# Patient Record
Sex: Male | Born: 1950 | Race: Black or African American | Hispanic: No | Marital: Single | Smoking: Never smoker
Health system: Southern US, Community
[De-identification: ages and names within clinical notes are randomized; demographics above are authoritative.]

## PROBLEM LIST (undated history)

## (undated) ENCOUNTER — Emergency Department (HOSPITAL_COMMUNITY): Discharge status: Against Medical Advice | Payer: No Typology Code available for payment source

## (undated) DIAGNOSIS — I2510 Atherosclerotic heart disease of native coronary artery without angina pectoris: Secondary | ICD-10-CM

## (undated) DIAGNOSIS — R209 Unspecified disturbances of skin sensation: Secondary | ICD-10-CM

## (undated) DIAGNOSIS — L909 Atrophic disorder of skin, unspecified: Secondary | ICD-10-CM

## (undated) DIAGNOSIS — L919 Hypertrophic disorder of the skin, unspecified: Secondary | ICD-10-CM

## (undated) DIAGNOSIS — K279 Peptic ulcer, site unspecified, unspecified as acute or chronic, without hemorrhage or perforation: Secondary | ICD-10-CM

## (undated) DIAGNOSIS — M545 Low back pain, unspecified: Secondary | ICD-10-CM

## (undated) DIAGNOSIS — S61409A Unspecified open wound of unspecified hand, initial encounter: Secondary | ICD-10-CM

## (undated) DIAGNOSIS — N318 Other neuromuscular dysfunction of bladder: Secondary | ICD-10-CM

## (undated) DIAGNOSIS — I10 Essential (primary) hypertension: Secondary | ICD-10-CM

## (undated) DIAGNOSIS — K219 Gastro-esophageal reflux disease without esophagitis: Secondary | ICD-10-CM

## (undated) DIAGNOSIS — R252 Cramp and spasm: Secondary | ICD-10-CM

## (undated) DIAGNOSIS — H538 Other visual disturbances: Secondary | ICD-10-CM

## (undated) DIAGNOSIS — K5900 Constipation, unspecified: Secondary | ICD-10-CM

## (undated) DIAGNOSIS — M25569 Pain in unspecified knee: Secondary | ICD-10-CM

## (undated) DIAGNOSIS — E669 Obesity, unspecified: Secondary | ICD-10-CM

## (undated) DIAGNOSIS — M1990 Unspecified osteoarthritis, unspecified site: Secondary | ICD-10-CM

## (undated) DIAGNOSIS — K589 Irritable bowel syndrome without diarrhea: Secondary | ICD-10-CM

## (undated) DIAGNOSIS — E785 Hyperlipidemia, unspecified: Secondary | ICD-10-CM

## (undated) DIAGNOSIS — F411 Generalized anxiety disorder: Secondary | ICD-10-CM

## (undated) DIAGNOSIS — M109 Gout, unspecified: Secondary | ICD-10-CM

## (undated) DIAGNOSIS — M549 Dorsalgia, unspecified: Secondary | ICD-10-CM

## (undated) DIAGNOSIS — E039 Hypothyroidism, unspecified: Secondary | ICD-10-CM

## (undated) DIAGNOSIS — F528 Other sexual dysfunction not due to a substance or known physiological condition: Secondary | ICD-10-CM

## (undated) DIAGNOSIS — I252 Old myocardial infarction: Secondary | ICD-10-CM

## (undated) DIAGNOSIS — F3289 Other specified depressive episodes: Secondary | ICD-10-CM

## (undated) DIAGNOSIS — R001 Bradycardia, unspecified: Secondary | ICD-10-CM

## (undated) DIAGNOSIS — F329 Major depressive disorder, single episode, unspecified: Secondary | ICD-10-CM

## (undated) HISTORY — DX: Low back pain: M54.5

## (undated) HISTORY — DX: Other visual disturbances: H53.8

## (undated) HISTORY — DX: Hyperlipidemia, unspecified: E78.5

## (undated) HISTORY — DX: Atherosclerotic heart disease of native coronary artery without angina pectoris: I25.10

## (undated) HISTORY — DX: Irritable bowel syndrome, unspecified: K58.9

## (undated) HISTORY — DX: Gastro-esophageal reflux disease without esophagitis: K21.9

## (undated) HISTORY — DX: Unspecified open wound of unspecified hand, initial encounter: S61.409A

## (undated) HISTORY — DX: Bradycardia, unspecified: R00.1

## (undated) HISTORY — DX: Cramp and spasm: R25.2

## (undated) HISTORY — DX: Peptic ulcer, site unspecified, unspecified as acute or chronic, without hemorrhage or perforation: K27.9

## (undated) HISTORY — DX: Unspecified osteoarthritis, unspecified site: M19.90

## (undated) HISTORY — DX: Atrophic disorder of skin, unspecified: L90.9

## (undated) HISTORY — DX: Constipation, unspecified: K59.00

## (undated) HISTORY — DX: Old myocardial infarction: I25.2

## (undated) HISTORY — DX: Other sexual dysfunction not due to a substance or known physiological condition: F52.8

## (undated) HISTORY — DX: Unspecified disturbances of skin sensation: R20.9

## (undated) HISTORY — PX: WISDOM TOOTH EXTRACTION: SHX21

## (undated) HISTORY — DX: Other neuromuscular dysfunction of bladder: N31.8

## (undated) HISTORY — DX: Obesity, unspecified: E66.9

## (undated) HISTORY — DX: Low back pain, unspecified: M54.50

## (undated) HISTORY — DX: Essential (primary) hypertension: I10

## (undated) HISTORY — DX: Other specified depressive episodes: F32.89

## (undated) HISTORY — DX: Hypothyroidism, unspecified: E03.9

## (undated) HISTORY — DX: Pain in unspecified knee: M25.569

## (undated) HISTORY — DX: Gout, unspecified: M10.9

## (undated) HISTORY — DX: Dorsalgia, unspecified: M54.9

## (undated) HISTORY — DX: Hypertrophic disorder of the skin, unspecified: L91.9

## (undated) HISTORY — DX: Major depressive disorder, single episode, unspecified: F32.9

## (undated) HISTORY — DX: Generalized anxiety disorder: F41.1

---

## 2000-10-20 ENCOUNTER — Inpatient Hospital Stay (HOSPITAL_COMMUNITY): Admission: EM | Admit: 2000-10-20 | Discharge: 2000-10-24 | Payer: Self-pay | Admitting: Cardiology

## 2000-10-20 ENCOUNTER — Encounter: Payer: Self-pay | Admitting: *Deleted

## 2000-10-22 ENCOUNTER — Encounter: Payer: Self-pay | Admitting: Cardiology

## 2003-04-26 HISTORY — PX: CARDIAC CATHETERIZATION: SHX172

## 2003-09-09 ENCOUNTER — Inpatient Hospital Stay (HOSPITAL_COMMUNITY): Admission: EM | Admit: 2003-09-09 | Discharge: 2003-09-11 | Payer: Self-pay | Admitting: Emergency Medicine

## 2004-10-12 ENCOUNTER — Ambulatory Visit: Payer: Self-pay | Admitting: *Deleted

## 2004-10-21 ENCOUNTER — Encounter (HOSPITAL_COMMUNITY): Admission: RE | Admit: 2004-10-21 | Discharge: 2004-10-22 | Payer: Self-pay | Admitting: *Deleted

## 2004-10-22 ENCOUNTER — Encounter (INDEPENDENT_AMBULATORY_CARE_PROVIDER_SITE_OTHER): Payer: Self-pay | Admitting: Family Medicine

## 2004-10-25 ENCOUNTER — Ambulatory Visit: Payer: Self-pay | Admitting: *Deleted

## 2004-10-25 ENCOUNTER — Ambulatory Visit (HOSPITAL_COMMUNITY): Admission: RE | Admit: 2004-10-25 | Discharge: 2004-10-25 | Payer: Self-pay | Admitting: Cardiology

## 2004-10-27 ENCOUNTER — Ambulatory Visit: Payer: Self-pay | Admitting: Cardiology

## 2004-11-04 ENCOUNTER — Inpatient Hospital Stay (HOSPITAL_COMMUNITY): Admission: EM | Admit: 2004-11-04 | Discharge: 2004-11-05 | Payer: Self-pay | Admitting: *Deleted

## 2004-11-05 ENCOUNTER — Encounter (INDEPENDENT_AMBULATORY_CARE_PROVIDER_SITE_OTHER): Payer: Self-pay | Admitting: Family Medicine

## 2004-11-05 ENCOUNTER — Ambulatory Visit: Payer: Self-pay | Admitting: Cardiology

## 2005-04-08 ENCOUNTER — Encounter (INDEPENDENT_AMBULATORY_CARE_PROVIDER_SITE_OTHER): Payer: Self-pay | Admitting: Family Medicine

## 2005-04-08 ENCOUNTER — Ambulatory Visit: Payer: Self-pay | Admitting: *Deleted

## 2005-10-20 ENCOUNTER — Encounter (INDEPENDENT_AMBULATORY_CARE_PROVIDER_SITE_OTHER): Payer: Self-pay | Admitting: Family Medicine

## 2005-12-16 ENCOUNTER — Ambulatory Visit: Payer: Self-pay | Admitting: Family Medicine

## 2005-12-19 ENCOUNTER — Telehealth (INDEPENDENT_AMBULATORY_CARE_PROVIDER_SITE_OTHER): Payer: Self-pay | Admitting: Family Medicine

## 2006-01-27 ENCOUNTER — Ambulatory Visit: Payer: Self-pay | Admitting: Family Medicine

## 2006-01-30 ENCOUNTER — Encounter (INDEPENDENT_AMBULATORY_CARE_PROVIDER_SITE_OTHER): Payer: Self-pay | Admitting: Family Medicine

## 2006-02-17 ENCOUNTER — Ambulatory Visit: Payer: Self-pay | Admitting: Family Medicine

## 2006-03-31 ENCOUNTER — Encounter (INDEPENDENT_AMBULATORY_CARE_PROVIDER_SITE_OTHER): Payer: Self-pay | Admitting: Family Medicine

## 2006-04-09 ENCOUNTER — Encounter: Payer: Self-pay | Admitting: Family Medicine

## 2006-04-09 DIAGNOSIS — M545 Low back pain: Secondary | ICD-10-CM

## 2006-04-09 DIAGNOSIS — K219 Gastro-esophageal reflux disease without esophagitis: Secondary | ICD-10-CM | POA: Insufficient documentation

## 2006-04-09 DIAGNOSIS — M1990 Unspecified osteoarthritis, unspecified site: Secondary | ICD-10-CM | POA: Insufficient documentation

## 2006-04-09 DIAGNOSIS — J45909 Unspecified asthma, uncomplicated: Secondary | ICD-10-CM | POA: Insufficient documentation

## 2006-04-09 DIAGNOSIS — K279 Peptic ulcer, site unspecified, unspecified as acute or chronic, without hemorrhage or perforation: Secondary | ICD-10-CM | POA: Insufficient documentation

## 2006-04-09 DIAGNOSIS — K589 Irritable bowel syndrome without diarrhea: Secondary | ICD-10-CM | POA: Insufficient documentation

## 2006-04-09 DIAGNOSIS — F329 Major depressive disorder, single episode, unspecified: Secondary | ICD-10-CM

## 2006-04-09 DIAGNOSIS — F411 Generalized anxiety disorder: Secondary | ICD-10-CM | POA: Insufficient documentation

## 2006-04-09 DIAGNOSIS — I10 Essential (primary) hypertension: Secondary | ICD-10-CM | POA: Insufficient documentation

## 2006-04-09 DIAGNOSIS — E039 Hypothyroidism, unspecified: Secondary | ICD-10-CM | POA: Insufficient documentation

## 2006-04-09 DIAGNOSIS — E785 Hyperlipidemia, unspecified: Secondary | ICD-10-CM | POA: Insufficient documentation

## 2006-04-09 DIAGNOSIS — K5900 Constipation, unspecified: Secondary | ICD-10-CM | POA: Insufficient documentation

## 2006-04-09 DIAGNOSIS — N318 Other neuromuscular dysfunction of bladder: Secondary | ICD-10-CM

## 2006-04-13 ENCOUNTER — Ambulatory Visit: Payer: Self-pay | Admitting: Family Medicine

## 2006-04-28 ENCOUNTER — Encounter (INDEPENDENT_AMBULATORY_CARE_PROVIDER_SITE_OTHER): Payer: Self-pay | Admitting: Family Medicine

## 2006-06-14 ENCOUNTER — Telehealth (INDEPENDENT_AMBULATORY_CARE_PROVIDER_SITE_OTHER): Payer: Self-pay | Admitting: *Deleted

## 2006-06-15 ENCOUNTER — Ambulatory Visit: Payer: Self-pay | Admitting: Family Medicine

## 2006-06-15 LAB — CONVERTED CEMR LAB
Cholesterol, target level: 200 mg/dL
HDL goal, serum: 40 mg/dL

## 2006-09-28 ENCOUNTER — Encounter (INDEPENDENT_AMBULATORY_CARE_PROVIDER_SITE_OTHER): Payer: Self-pay | Admitting: Family Medicine

## 2006-10-05 ENCOUNTER — Ambulatory Visit: Payer: Self-pay | Admitting: Family Medicine

## 2006-10-06 ENCOUNTER — Encounter (INDEPENDENT_AMBULATORY_CARE_PROVIDER_SITE_OTHER): Payer: Self-pay | Admitting: Family Medicine

## 2006-10-12 ENCOUNTER — Encounter (INDEPENDENT_AMBULATORY_CARE_PROVIDER_SITE_OTHER): Payer: Self-pay | Admitting: Family Medicine

## 2006-10-12 LAB — CONVERTED CEMR LAB
AST: 22 units/L (ref 0–37)
Alkaline Phosphatase: 51 units/L (ref 39–117)
Basophils Absolute: 0 10*3/uL (ref 0.0–0.1)
CO2: 26 meq/L (ref 19–32)
Chloride: 105 meq/L (ref 96–112)
Cholesterol: 208 mg/dL — ABNORMAL HIGH (ref 0–200)
Glucose, Bld: 108 mg/dL — ABNORMAL HIGH (ref 70–99)
HCT: 39.4 % (ref 39.0–52.0)
HDL: 35 mg/dL — ABNORMAL LOW (ref >39)
LDL Cholesterol: 142 mg/dL — ABNORMAL HIGH (ref 0–99)
MCV: 94 fL (ref 78.0–100.0)
Monocytes Relative: 5 % (ref 3–11)
Neutro Abs: 2 10*3/uL (ref 1.7–7.7)
Neutrophils Relative %: 42 % — ABNORMAL LOW (ref 43–77)
Platelets: 300 10*3/uL (ref 150–400)
RDW: 13.5 % (ref 11.5–14.0)
Sodium: 141 meq/L (ref 135–145)
Total CHOL/HDL Ratio: 5.9
Triglycerides: 154 mg/dL — ABNORMAL HIGH (ref <150)
VLDL: 31 mg/dL (ref 0–40)

## 2006-10-18 ENCOUNTER — Encounter (INDEPENDENT_AMBULATORY_CARE_PROVIDER_SITE_OTHER): Payer: Self-pay | Admitting: Family Medicine

## 2006-10-19 ENCOUNTER — Telehealth (INDEPENDENT_AMBULATORY_CARE_PROVIDER_SITE_OTHER): Payer: Self-pay | Admitting: *Deleted

## 2006-10-19 ENCOUNTER — Ambulatory Visit: Payer: Self-pay | Admitting: Family Medicine

## 2006-10-19 DIAGNOSIS — F528 Other sexual dysfunction not due to a substance or known physiological condition: Secondary | ICD-10-CM

## 2006-10-19 DIAGNOSIS — E669 Obesity, unspecified: Secondary | ICD-10-CM

## 2006-10-20 ENCOUNTER — Encounter (INDEPENDENT_AMBULATORY_CARE_PROVIDER_SITE_OTHER): Payer: Self-pay | Admitting: Family Medicine

## 2006-10-20 LAB — CONVERTED CEMR LAB
Ferritin: 189 ng/mL (ref 22–322)
Retic Count, Absolute: 65.1 (ref 19.0–186.0)
Retic Ct Pct: 1.5 % (ref 0.4–3.1)
UIBC: 201 ug/dL
Vitamin B-12: 366 pg/mL (ref 211–911)

## 2006-11-10 ENCOUNTER — Ambulatory Visit: Payer: Self-pay | Admitting: Cardiology

## 2006-11-10 ENCOUNTER — Encounter (INDEPENDENT_AMBULATORY_CARE_PROVIDER_SITE_OTHER): Payer: Self-pay | Admitting: Family Medicine

## 2006-12-05 ENCOUNTER — Ambulatory Visit: Payer: Self-pay | Admitting: Family Medicine

## 2007-01-17 ENCOUNTER — Ambulatory Visit: Payer: Self-pay | Admitting: Family Medicine

## 2007-01-17 ENCOUNTER — Telehealth (INDEPENDENT_AMBULATORY_CARE_PROVIDER_SITE_OTHER): Payer: Self-pay | Admitting: *Deleted

## 2007-01-17 DIAGNOSIS — M549 Dorsalgia, unspecified: Secondary | ICD-10-CM | POA: Insufficient documentation

## 2007-01-17 DIAGNOSIS — M109 Gout, unspecified: Secondary | ICD-10-CM

## 2007-01-18 ENCOUNTER — Ambulatory Visit (HOSPITAL_COMMUNITY): Admission: RE | Admit: 2007-01-18 | Discharge: 2007-01-18 | Payer: Self-pay | Admitting: Family Medicine

## 2007-01-18 ENCOUNTER — Telehealth (INDEPENDENT_AMBULATORY_CARE_PROVIDER_SITE_OTHER): Payer: Self-pay | Admitting: *Deleted

## 2007-01-18 ENCOUNTER — Encounter (INDEPENDENT_AMBULATORY_CARE_PROVIDER_SITE_OTHER): Payer: Self-pay | Admitting: Family Medicine

## 2007-01-18 LAB — CONVERTED CEMR LAB
Basophils Relative: 1 % (ref 0–1)
Eosinophils Absolute: 0.1 10*3/uL (ref 0.0–0.7)
Eosinophils Relative: 3 % (ref 0–5)
HCT: 39.4 % (ref 39.0–52.0)
Hemoglobin: 13.2 g/dL (ref 13.0–17.0)
Neutro Abs: 1.8 10*3/uL (ref 1.7–7.7)
RBC: 4.39 M/uL (ref 4.22–5.81)
TSH: 5.218 microintl units/mL (ref 0.350–5.50)

## 2007-01-22 ENCOUNTER — Encounter (INDEPENDENT_AMBULATORY_CARE_PROVIDER_SITE_OTHER): Payer: Self-pay | Admitting: Family Medicine

## 2007-01-22 ENCOUNTER — Telehealth (INDEPENDENT_AMBULATORY_CARE_PROVIDER_SITE_OTHER): Payer: Self-pay | Admitting: *Deleted

## 2007-01-24 ENCOUNTER — Telehealth (INDEPENDENT_AMBULATORY_CARE_PROVIDER_SITE_OTHER): Payer: Self-pay | Admitting: *Deleted

## 2007-02-12 ENCOUNTER — Encounter (INDEPENDENT_AMBULATORY_CARE_PROVIDER_SITE_OTHER): Payer: Self-pay | Admitting: Family Medicine

## 2007-02-14 ENCOUNTER — Ambulatory Visit: Payer: Self-pay | Admitting: Family Medicine

## 2007-02-19 ENCOUNTER — Ambulatory Visit (HOSPITAL_COMMUNITY): Admission: RE | Admit: 2007-02-19 | Discharge: 2007-02-19 | Payer: Self-pay | Admitting: Internal Medicine

## 2007-02-19 ENCOUNTER — Ambulatory Visit: Payer: Self-pay | Admitting: Internal Medicine

## 2007-03-06 ENCOUNTER — Emergency Department (HOSPITAL_COMMUNITY): Admission: EM | Admit: 2007-03-06 | Discharge: 2007-03-06 | Payer: Self-pay | Admitting: Emergency Medicine

## 2007-03-07 ENCOUNTER — Ambulatory Visit: Payer: Self-pay | Admitting: Urgent Care

## 2007-05-17 ENCOUNTER — Ambulatory Visit: Payer: Self-pay | Admitting: Family Medicine

## 2007-05-21 ENCOUNTER — Encounter (INDEPENDENT_AMBULATORY_CARE_PROVIDER_SITE_OTHER): Payer: Self-pay | Admitting: Family Medicine

## 2007-05-22 ENCOUNTER — Telehealth (INDEPENDENT_AMBULATORY_CARE_PROVIDER_SITE_OTHER): Payer: Self-pay | Admitting: *Deleted

## 2007-05-22 LAB — CONVERTED CEMR LAB
AST: 18 units/L (ref 0–37)
Albumin: 4.1 g/dL (ref 3.5–5.2)
Basophils Relative: 0 % (ref 0–1)
CO2: 28 meq/L (ref 19–32)
Calcium: 9.7 mg/dL (ref 8.4–10.5)
Creatinine, Ser: 1.2 mg/dL (ref 0.40–1.50)
Eosinophils Relative: 3 % (ref 0–5)
Glucose, Bld: 98 mg/dL (ref 70–99)
HDL: 39 mg/dL — ABNORMAL LOW (ref >39)
Hemoglobin: 13.4 g/dL (ref 13.0–17.0)
Lymphocytes Relative: 51 % — ABNORMAL HIGH (ref 12–46)
Lymphs Abs: 2.4 10*3/uL (ref 0.7–4.0)
Monocytes Absolute: 0.4 10*3/uL (ref 0.1–1.0)
Neutro Abs: 1.7 10*3/uL (ref 1.7–7.7)
Potassium: 4.2 meq/L (ref 3.5–5.3)
RBC: 4.36 M/uL (ref 4.22–5.81)
RDW: 13.1 % (ref 11.5–15.5)
TSH: 4.53 microintl units/mL (ref 0.350–5.50)
Total Protein: 7.3 g/dL (ref 6.0–8.3)

## 2007-05-31 ENCOUNTER — Ambulatory Visit: Payer: Self-pay | Admitting: Family Medicine

## 2007-08-30 ENCOUNTER — Ambulatory Visit: Payer: Self-pay | Admitting: Family Medicine

## 2007-08-30 LAB — CONVERTED CEMR LAB
Blood in Urine, dipstick: NEGATIVE
Glucose, Urine, Semiquant: NEGATIVE
Ketones, urine, test strip: NEGATIVE
Protein, U semiquant: NEGATIVE
WBC Urine, dipstick: NEGATIVE
pH: 5.5

## 2007-09-07 ENCOUNTER — Ambulatory Visit: Payer: Self-pay | Admitting: Family Medicine

## 2007-09-08 ENCOUNTER — Encounter (INDEPENDENT_AMBULATORY_CARE_PROVIDER_SITE_OTHER): Payer: Self-pay | Admitting: Family Medicine

## 2007-09-10 ENCOUNTER — Telehealth (INDEPENDENT_AMBULATORY_CARE_PROVIDER_SITE_OTHER): Payer: Self-pay | Admitting: *Deleted

## 2007-10-01 ENCOUNTER — Ambulatory Visit: Payer: Self-pay | Admitting: Internal Medicine

## 2007-10-15 ENCOUNTER — Telehealth (INDEPENDENT_AMBULATORY_CARE_PROVIDER_SITE_OTHER): Payer: Self-pay | Admitting: Family Medicine

## 2007-10-16 ENCOUNTER — Ambulatory Visit: Payer: Self-pay | Admitting: Family Medicine

## 2007-11-12 ENCOUNTER — Ambulatory Visit: Payer: Self-pay | Admitting: Family Medicine

## 2007-11-13 ENCOUNTER — Encounter (INDEPENDENT_AMBULATORY_CARE_PROVIDER_SITE_OTHER): Payer: Self-pay | Admitting: Family Medicine

## 2007-11-13 LAB — CONVERTED CEMR LAB
BUN: 15 mg/dL (ref 6–23)
Glucose, Bld: 84 mg/dL (ref 70–99)
Sodium: 141 meq/L (ref 135–145)

## 2007-12-10 ENCOUNTER — Ambulatory Visit: Payer: Self-pay | Admitting: Family Medicine

## 2007-12-18 ENCOUNTER — Encounter (INDEPENDENT_AMBULATORY_CARE_PROVIDER_SITE_OTHER): Payer: Self-pay | Admitting: Family Medicine

## 2007-12-19 LAB — CONVERTED CEMR LAB
Bilirubin, Direct: <0.1 mg/dL (ref 0.0–0.3)
Total Bilirubin: 0.3 mg/dL (ref 0.3–1.2)
Total CHOL/HDL Ratio: 5.3
VLDL: 28 mg/dL (ref 0–40)

## 2007-12-20 ENCOUNTER — Encounter (INDEPENDENT_AMBULATORY_CARE_PROVIDER_SITE_OTHER): Payer: Self-pay | Admitting: Family Medicine

## 2008-01-11 ENCOUNTER — Ambulatory Visit: Payer: Self-pay | Admitting: Family Medicine

## 2008-01-12 ENCOUNTER — Encounter (INDEPENDENT_AMBULATORY_CARE_PROVIDER_SITE_OTHER): Payer: Self-pay | Admitting: Family Medicine

## 2008-01-15 LAB — CONVERTED CEMR LAB: Uric Acid, Serum: 9.9 mg/dL — ABNORMAL HIGH (ref 4.0–7.8)

## 2008-01-18 ENCOUNTER — Ambulatory Visit: Payer: Self-pay | Admitting: Family Medicine

## 2008-01-25 ENCOUNTER — Telehealth (INDEPENDENT_AMBULATORY_CARE_PROVIDER_SITE_OTHER): Payer: Self-pay | Admitting: Family Medicine

## 2008-01-31 ENCOUNTER — Encounter (INDEPENDENT_AMBULATORY_CARE_PROVIDER_SITE_OTHER): Payer: Self-pay | Admitting: Family Medicine

## 2008-02-29 ENCOUNTER — Ambulatory Visit: Payer: Self-pay | Admitting: Family Medicine

## 2008-03-01 ENCOUNTER — Encounter (INDEPENDENT_AMBULATORY_CARE_PROVIDER_SITE_OTHER): Payer: Self-pay | Admitting: Family Medicine

## 2008-03-03 LAB — CONVERTED CEMR LAB: TSH: 3.429 microintl units/mL (ref 0.350–4.50)

## 2008-03-21 ENCOUNTER — Encounter (INDEPENDENT_AMBULATORY_CARE_PROVIDER_SITE_OTHER): Payer: Self-pay | Admitting: Family Medicine

## 2008-03-25 ENCOUNTER — Encounter (INDEPENDENT_AMBULATORY_CARE_PROVIDER_SITE_OTHER): Payer: Self-pay | Admitting: *Deleted

## 2008-03-25 LAB — CONVERTED CEMR LAB
ALT: 23 units/L (ref 0–53)
Calcium: 9.2 mg/dL (ref 8.4–10.5)
Chloride: 104 meq/L (ref 96–112)
Cholesterol: 204 mg/dL — ABNORMAL HIGH (ref 0–200)
HDL: 36 mg/dL — ABNORMAL LOW (ref >39)
Potassium: 4.2 meq/L (ref 3.5–5.3)
Total Protein: 7 g/dL (ref 6.0–8.3)
Triglycerides: 171 mg/dL — ABNORMAL HIGH (ref <150)
Uric Acid, Serum: 9 mg/dL — ABNORMAL HIGH (ref 4.0–7.8)

## 2008-04-01 ENCOUNTER — Ambulatory Visit: Payer: Self-pay | Admitting: Cardiology

## 2008-04-01 DIAGNOSIS — I2510 Atherosclerotic heart disease of native coronary artery without angina pectoris: Secondary | ICD-10-CM

## 2008-04-14 ENCOUNTER — Ambulatory Visit: Payer: Self-pay | Admitting: Family Medicine

## 2008-06-16 ENCOUNTER — Ambulatory Visit: Payer: Self-pay | Admitting: Family Medicine

## 2008-06-20 ENCOUNTER — Encounter (INDEPENDENT_AMBULATORY_CARE_PROVIDER_SITE_OTHER): Payer: Self-pay | Admitting: Family Medicine

## 2008-09-19 ENCOUNTER — Encounter (INDEPENDENT_AMBULATORY_CARE_PROVIDER_SITE_OTHER): Payer: Self-pay | Admitting: Family Medicine

## 2008-10-13 ENCOUNTER — Ambulatory Visit: Payer: Self-pay | Admitting: Family Medicine

## 2008-10-13 ENCOUNTER — Encounter (INDEPENDENT_AMBULATORY_CARE_PROVIDER_SITE_OTHER): Payer: Self-pay | Admitting: *Deleted

## 2008-10-13 DIAGNOSIS — J301 Allergic rhinitis due to pollen: Secondary | ICD-10-CM

## 2008-10-13 LAB — CONVERTED CEMR LAB
ALT: 20 units/L
Albumin: 4 g/dL
Alkaline Phosphatase: 54 units/L
Calcium: 9.2 mg/dL
Creatinine, Ser: 1.11 mg/dL
Glucose, Bld: 115 mg/dL
Potassium: 3.9 meq/L
Sodium: 142 meq/L
Total Protein: 7.3 g/dL

## 2008-10-14 ENCOUNTER — Encounter (INDEPENDENT_AMBULATORY_CARE_PROVIDER_SITE_OTHER): Payer: Self-pay | Admitting: Family Medicine

## 2008-10-14 ENCOUNTER — Encounter (INDEPENDENT_AMBULATORY_CARE_PROVIDER_SITE_OTHER): Payer: Self-pay | Admitting: *Deleted

## 2008-10-14 LAB — CONVERTED CEMR LAB
ALT: 20 units/L (ref 0–53)
BUN: 17 mg/dL (ref 6–23)
Chloride: 108 meq/L (ref 96–112)
Creatinine, Ser: 1.11 mg/dL (ref 0.40–1.50)
HDL: 35 mg/dL — ABNORMAL LOW (ref >39)
LDL Cholesterol: 129 mg/dL — ABNORMAL HIGH (ref 0–99)
Potassium: 3.9 meq/L (ref 3.5–5.3)
TSH: 3.485 microintl units/mL (ref 0.350–4.500)
VLDL: 25 mg/dL (ref 0–40)

## 2008-12-08 ENCOUNTER — Ambulatory Visit: Payer: Self-pay | Admitting: Family Medicine

## 2008-12-12 ENCOUNTER — Ambulatory Visit: Payer: Self-pay | Admitting: Cardiology

## 2008-12-12 DIAGNOSIS — I495 Sick sinus syndrome: Secondary | ICD-10-CM | POA: Insufficient documentation

## 2009-12-02 ENCOUNTER — Encounter (INDEPENDENT_AMBULATORY_CARE_PROVIDER_SITE_OTHER): Payer: Self-pay | Admitting: *Deleted

## 2009-12-04 ENCOUNTER — Encounter: Payer: Self-pay | Admitting: Cardiology

## 2009-12-04 ENCOUNTER — Encounter (INDEPENDENT_AMBULATORY_CARE_PROVIDER_SITE_OTHER): Payer: Self-pay | Admitting: *Deleted

## 2009-12-08 ENCOUNTER — Encounter (INDEPENDENT_AMBULATORY_CARE_PROVIDER_SITE_OTHER): Payer: Self-pay | Admitting: *Deleted

## 2009-12-08 ENCOUNTER — Encounter: Payer: Self-pay | Admitting: Adult Health

## 2009-12-08 ENCOUNTER — Ambulatory Visit: Payer: Self-pay | Admitting: Cardiology

## 2009-12-08 DIAGNOSIS — R079 Chest pain, unspecified: Secondary | ICD-10-CM

## 2009-12-08 DIAGNOSIS — R011 Cardiac murmur, unspecified: Secondary | ICD-10-CM

## 2009-12-09 LAB — CONVERTED CEMR LAB
Cholesterol: 201 mg/dL — ABNORMAL HIGH (ref 0–200)
LDL Cholesterol: 144 mg/dL — ABNORMAL HIGH (ref 0–99)

## 2009-12-11 ENCOUNTER — Encounter (HOSPITAL_COMMUNITY): Admission: RE | Admit: 2009-12-11 | Discharge: 2010-01-10 | Payer: Self-pay | Admitting: Cardiology

## 2009-12-11 ENCOUNTER — Ambulatory Visit: Payer: Self-pay | Admitting: Cardiology

## 2009-12-11 ENCOUNTER — Encounter: Payer: Self-pay | Admitting: Cardiology

## 2009-12-22 ENCOUNTER — Ambulatory Visit: Payer: Self-pay | Admitting: Cardiology

## 2009-12-31 ENCOUNTER — Ambulatory Visit (HOSPITAL_COMMUNITY): Admission: RE | Admit: 2009-12-31 | Discharge: 2009-12-31 | Payer: Self-pay | Admitting: Internal Medicine

## 2010-01-04 ENCOUNTER — Ambulatory Visit: Payer: Self-pay | Admitting: Cardiology

## 2010-01-05 ENCOUNTER — Encounter (INDEPENDENT_AMBULATORY_CARE_PROVIDER_SITE_OTHER): Payer: Self-pay | Admitting: *Deleted

## 2010-01-15 ENCOUNTER — Encounter (INDEPENDENT_AMBULATORY_CARE_PROVIDER_SITE_OTHER): Payer: Self-pay | Admitting: *Deleted

## 2010-02-09 ENCOUNTER — Emergency Department (HOSPITAL_COMMUNITY): Admission: EM | Admit: 2010-02-09 | Discharge: 2010-02-10 | Payer: Self-pay | Admitting: Emergency Medicine

## 2010-04-12 ENCOUNTER — Ambulatory Visit: Payer: Self-pay | Admitting: Cardiology

## 2010-05-16 ENCOUNTER — Encounter: Payer: Self-pay | Admitting: Family Medicine

## 2010-05-23 LAB — CONVERTED CEMR LAB
Albumin: 4.2 g/dL (ref 3.5–5.2)
Alkaline Phosphatase: 49 units/L (ref 39–117)
Chloride: 104 meq/L (ref 96–112)
Glucose, Bld: 92 mg/dL (ref 70–99)
HDL: 40 mg/dL (ref >39)
Potassium: 4.4 meq/L (ref 3.5–5.3)
Total Bilirubin: 0.4 mg/dL (ref 0.3–1.2)
Total CHOL/HDL Ratio: 4.9
Triglycerides: 110 mg/dL (ref <150)
WBC, blood: 4.1 10*3/uL

## 2010-05-25 NOTE — Assessment & Plan Note (Signed)
Summary: friday bp check per checkout on 12/22/09/tg  Nurse Visit   Vital Signs:  Patient profile:   60 year old male Height:      75 inches Weight:      259 pounds BMI:     32.49 O2 Sat:      98 % on Room air Pulse rate:   45 / minute Pulse rhythm:   regular BP sitting:   124 / 80  (left arm)  Vitals Entered By: Teressa Lower RN (January 04, 2010 9:27 AM)  Nutrition Counseling: Patient's BMI is greater than 25 and therefore counseled on weight management options.  O2 Flow:  Room air  Visit Type:  2 week nurse visit Primary Provider:  Dr.Fanta   History of Present Illness: S:  2 week follow up B:  ov 12/22/09, started amlodipine 10mg  daily A:  no c/o  R: gave handouts on low salt diet and low cholesterol diets   Current Medications (verified): 1)  Aspirin 81 Mg Tbec (Aspirin) .... Once Daily 2)  Synthroid 75 Mcg Tabs (Levothyroxine Sodium) .... Once Daily 3)  Garlic 500 Mg  Tabs (Garlic) .... Two Times A Day 4)  Norvasc 10 Mg Tabs (Amlodipine Besylate) .... Take 1 Tablet By Mouth Once Daily 5)  Indomethacin 50 Mg Caps (Indomethacin) .... Take As Needed  Allergies: 1)  ! * Plavix 2)  ! Altace 3)  ! Uloric (Febuxostat) 4)  ! * Avorastatin. 5)  ! Celebrex  Continue medications. Follow up appointment as scheduled./per Joni Reining NP    Teressa Lower RN  January 05, 2010 10:40 AM  I was unable to reach Mr. Loeza by telephone, sent pt a letter re: nurse visit

## 2010-05-25 NOTE — Miscellaneous (Signed)
Summary: labs cmp,lipids,tsh,10/13/2008  Clinical Lists Changes  Observations: Added new observation of CALCIUM: 9.2 mg/dL (16/01/9603 54:09) Added new observation of ALBUMIN: 4.0 g/dL (81/19/1478 29:56) Added new observation of PROTEIN, TOT: 7.3 g/dL (21/30/8657 84:69) Added new observation of SGPT (ALT): 20 units/L (10/13/2008 10:15) Added new observation of SGOT (AST): 33 units/L (10/13/2008 10:15) Added new observation of ALK PHOS: 54 units/L (10/13/2008 10:15) Added new observation of CREATININE: 1.11 mg/dL (62/95/2841 32:44) Added new observation of BUN: 17 mg/dL (04/27/7251 66:44) Added new observation of BG RANDOM: 115 mg/dL (03/47/4259 56:38) Added new observation of CO2 PLSM/SER: 20 meq/L (10/13/2008 10:15) Added new observation of CL SERUM: 108 meq/L (10/13/2008 10:15) Added new observation of K SERUM: 3.9 meq/L (10/13/2008 10:15) Added new observation of NA: 142 meq/L (10/13/2008 10:15) Added new observation of LDL: 129 mg/dL (75/64/3329 51:88) Added new observation of HDL: 35 mg/dL (41/66/0630 16:01) Added new observation of TRIGLYC TOT: 126 mg/dL (09/32/3557 32:20) Added new observation of CHOLESTEROL: 189 mg/dL (25/42/7062 37:62) Added new observation of TSH: 3.485 microintl units/mL (10/13/2008 10:15)

## 2010-05-25 NOTE — Letter (Signed)
Summary: [place] Results Engineer, agricultural at [facility]  [address] [place] [address]   Phone: [phone]  Fax: [phone]      January 15, 2010 MRN: [id number]   [facility] [place] [address] [place], [place]  [address]   Dear Mr. [name] [name],  Your test ordered by Selena Batten has been reviewed by your physician (or physician assistant) and was found to be normal or stable. Your physician (or physician assistant) felt no changes were needed at this time.  __x__ Echocardiogram  ____ Cardiac Stress Test  ____ Lab Work  ____ Peripheral vascular study of arms, legs or neck  ____ CT scan or X-ray  ____ Lung or Breathing test  ____ Other:  No change in medical treatment at this time, per Dr. Dietrich Pates.  Thank you, Tammy Allyne Gee RN    Bainbridge Bing, MD, Lenise Arena.C.Gaylord Shih, MD, F.A.C.C Lewayne Bunting, MD, F.A.C.C Nona Dell, MD, F.A.C.C Charlton Haws, MD, Lenise Arena.C.C

## 2010-05-25 NOTE — Letter (Signed)
Summary: [name] [name] (Nuc Med Stress)  [facility]  [address], [place] [address]   Phone: [phone]  Fax: [phone]    Nuclear Medicine 1-Day Stress Test Information Sheet  Re:     [name] [name]   DOB:     Oct 12, 1950 MRN:     [id number] Weight:  Appointment Date: Register at: Appointment Time: Referring MD:  _X__Exercise Stress  __Adenosine   __Dobutamine  __Lexiscan  __Persantine   __Thallium  Urgency: ____1 (next day)   ____2 (one week)    ____3 (PRN)  Patient will receive Follow Up call with results: Patient needs follow-up appointment:  Instructions regarding medication:  How to prepare for your stress test: 1. DO NOT eat or dring 6 hours prior to your arrival time. This includes no caffeine (coffee, tea, sodas, chocolate) if you were instructed to take your medications, drink water with it. 2. DO NOT use any tobacco products for at leaset 8 hours prior to arrival. 3. DO NOT wear dresses or any clothing that may have metal clasps or buttons. 4. Wear short sleeve shirts, loose clothing, and comfortalbe walking shoes. 5. DO NOT use lotions, oils or powder on your chest before the test. 6. The test will take approximately 3-4 hours from the time you arrive until completion. 7. To register the day of the test, go to the Short Stay entrance at [facility]. 8. If you must cancel your test, call [phone] as soon as you are aware.  After you arrive for test:   When you arrive at [facility], you will go to Short Stay to be registered. They will then send you to Radiology to check in. The Nuclear Medicine Tech will get you and start an IV in your arm or hand. A small amount of a radioactive tracer will then be injected into your IV. This tracer will then have to circulate for 30-45 minutes. During this time you will wait in the waiting room and you will be able to drink something without caffeine. A series of pictures will be taken  of your heart follwoing this waiting period. After the 1st set of pictures you will go to the stress lab to get ready for your stress test. During the stress test, another small amount of a radioactive tracer will be injected through your IV. When the stress test is complete, there is a short rest period while your heart rate and blood pressure will be monitored. When this monitoring period is complete you will have another set of pictrues taken. (The same as the 1st set of pictures). These pictures are taken between 15 minutes and 1 hour after the stress test. The time depends on the type of stress test you had. Your doctor will inform you of your test results within 7 days after test.    The possibilities of certain changes are possible during the test. They include abnormal blood pressure and disorders of the heart. Side effects of persantine or adenosine can include flushing, chest pain, shortness of breath, stomach tightness, headache and light-headedness. These side effects usually do not last long and are self-resolving. Every effort will be made to keep you comfortable and to minimize complications by obtaining a medical history and by close observation during the test. Emergency equipment, medications, and trained personnel are available to deal with any unusual situation which may arise.  Please notify office at least 48 hours in advance if you are unable to keep  this appt.

## 2010-05-25 NOTE — Assessment & Plan Note (Signed)
Summary: 1 yr fu/sn   Visit Type:  Follow-up Primary Provider:  Dr.Fanta  CC:  no cardiology complaints.  History of Present Illness: [name] [name] is a 60 y/o AAM who we are seeing on annual follow-up for nonobstructive CAD, Hypertension, hypercholesterolemia.  He has not been taking antihypertensives for several months and has also been noncompliant on other medications.  He states he is easily  allergic to several medications, to include ACE-inhibitors and ARB's.  He was on Norvasc 5 mg on last visit, and BP was controlled at that time.  He has had frequent bouts of gout and some substernal chest pain with exertion.  He takes an aspirin and about later, he feels better.  He is seeing Dr. Felecia Shelling now who has done recent lab work for cholesterol, but has not prescribed a medication for this.  He is otherwise doing well, and continues to be active.  He has lost approximately 20lbs since last visit.  Current Medications (verified): 1)  Aspirin 81 Mg Tbec (Aspirin) .... Once Daily 2)  Synthroid 75 Mcg Tabs (Levothyroxine Sodium) .... Once Daily 3)  Garlic 500 Mg  Tabs (Garlic) .... Two Times A Day 4)  Caduet 5-20 Mg Tabs (Amlodipine-Atorvastatin) .... Take 1 Tablet By Mouth Once A Day  Allergies (verified): 1)  ! * Plavix 2)  ! Altace 3)  ! Uloric (Febuxostat)  Past History:  Past medical, surgical, family and social histories (including risk factors) reviewed, and no changes noted (except as noted below).  Past Medical History: Reviewed history from 12/12/2008 and no changes required. CORONARY ARTERY DISEASE (ICD-414.00): Nonobstructive at catheterization in 10/2004. Cardiolyte 2006: EF          57 %; Ant Lat Sept Iscemia HYPOTHYROIDISM (ICD-244.9) HYPERTENSION (ICD-401.9) HYPERLIPIDEMIA (ICD-272.4)ASTHMA (ICD-493.90) ERECTILE DYSFUNCTION (ICD-302.72) OBESITY NOS (ICD-278.00) OVERACTIVE BLADDER (ICD-596.51) CONSTIPATION NOS (ICD-564.00) IBS (ICD-564.1) PUD (ICD-533.90) Sinus  bradycardia. OSTEOARTHRITIS (ICD-715.90):known cervical spine disease with chronic neck and shoulder discomfort MYOCARDIAL INFARCTION, HX OF (ICD-412) LOW BACK PAIN (ICD-724.2) GERD (ICD-530.81) DEPRESSION (ICD-311) ANXIETY (ICD-300.00) BLURRED VISION (ICD-368.8) PARESTHESIA - RIGHT LEG (ICD-782.0) LEG CRAMPS (ICD-729.82) LACERATION, HAND, RIGHT (ICD-882.0) KNEE PAIN, RIGHT (ICD-719.46) SKIN TAG (ICD-701.9) BACK PAIN (ICD-724.5) GOUT NOS (ICD-274.9)  Past Surgical History: Reviewed history from 10/13/2008 and no changes required. 1. Wisdom Teeth 2. Heart Cath 2005: 50 % Sept  and EF 65%, Non ostructive large vessel CAD  Family History: Reviewed history from 10/13/2008 and no changes required. Father: Dead 58 ? Lung Cancer Mother: 38 HTN/Alzheimers Siblings: Sisters 44 and 50 and brother 45 - CAD/HTN/DM Kids: None  Social History: Reviewed history from 10/13/2008 and no changes required. 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  All other systems have been reviewed and are negative unless stated above. \par Vital Signs:  Patient profile:   60 year old male Weight:      264 pounds BMI:     34.02 Pulse rate:   51 / minute BP sitting:   162 / 87  (right arm)  Vitals Entered By: Dreama Saa, CNA (December 08, 2009 2:14 PM)  Physical Exam  General:  Well developed, well nourished, in no acute distress. Head:  normocephalic and atraumatic Eyes:  PERRLA/EOM intact; conjunctiva and lids normal. Lungs:  Clear bilaterally to auscultation and percussion. Heart:  2/6 systolic murnur loudest at primary aortic area, with  out radiation to the carotids. RRR Abdomen:  Bowel sounds positive; abdomen soft and non-tender without masses, organomegaly, or hernias noted. No hepatosplenomegaly. Obese Msk:  Back normal, normal gait. Muscle strength and  tone normal. Pulses:  pulses normal in all 4 extremities Extremities:  trace left pedal edema and trace right pedal edema.   Neurologic:  Alert and oriented x 3. Psych:  Normal affect.   Impression & Recommendations:  Problem # 1:  HYPERTENSION (ICD-401.9) He has not been taking antihypertensives for several months.  I have given him samples of caduet (amlopdine 5mg /avorastatin 20mg ) to assit with BP control as he is intolerant to ARB's and ACE-inhibitors.  He has been on amlodipine in the past and tolerated it.  If his BP is better controlled, will provide a Rx for this.  He states that cost is an issue for him.  Will monitor closely, The following medications were removed from the medication list:    Norvasc 10 Mg Tabs (Amlodipine besylate) .Marland Kitchen... 1/2 by mouth daily His updated medication list for this problem includes:    Aspirin 81 Mg Tbec (Aspirin) ..... Once daily    Caduet 5-20 Mg Tabs (Amlodipine-atorvastatin) .Marland Kitchen... Take 1 tablet by mouth once a day  Orders: EKG w/ Interpretation (93000)  Problem # 2:  HYPERLIPIDEMIA (ICD-272.4) As above, avorastatin is given to him as sample.  LDL is 129, TC 186.  Would hope for improvement in levels on next blood draw.  I do not see any intolerances with this medication on review of his records. The following medications were removed from the medication list:    Welchol 625 Mg Tabs (Colesevelam hcl) .Marland Kitchen... Take 1 tab three times a day His updated medication list for this problem includes:    Caduet 5-20 Mg Tabs (Amlodipine-atorvastatin) .Marland Kitchen... Take 1 tablet by mouth once a day  Problem # 3:  MURMUR (ICD-785.2) Significant systolic murmur, appears to be aortic per ascultation.  Will have echo completed to evaluate this and for LV fx. Orders: 2-D Echocardiogram (2D Echo)  Problem # 4:  CHEST PAIN-UNSPECIFIED (ICD-786.50) May be related to hypertension which increases with exertion.   However he has multiple CVRF's.  I will plan for nuclear  stress myoview. We will follow-up after all tests are completed. The following medications were removed from the medication list:    Norvasc 10 Mg Tabs (Amlodipine besylate) .Marland Kitchen... 1/2 by mouth daily His updated medication list for this problem includes:    Aspirin 81 Mg Tbec (Aspirin) ..... Once daily  Other Orders: Nuclear Stress Test (Nuc Stress Test)  Patient Instructions: 1)  Your physician has requested that you have an echocardiogram.  Echocardiography is a painless test that uses sound waves to create images of your heart. It provides your doctor with information about the size and shape of your heart and how well your heart's chambers and valves are working.  This procedure takes approximately one hour. There are no restrictions for this procedure. 2)  Your physician has requested that you have an exercise stress myoview.  For further information please visit [email or web address]  Please follow instruction sheet, as given. 3)  Your physician recommends that you schedule a follow-up appointment in: 2 WEEKS. 4)  You have been given samples of caduet. Your bloodpressure must be followed up on this week either from stress test or in office on Friday,whichever comes first.

## 2010-05-25 NOTE — Letter (Signed)
Summary: Generic Letter  Architectural technologist at [place]  [address], [place] [address]   Phone: [phone]  Fax: [phone]        January 05, 2010 MRN: [id number]    [facility] [place] [address] [place], [place]  [address]    Dear Mr. EHRLER,   We were unable to reach you by telephone.  Please continue current medication and keep your follow up appointment on April 12, 2010 at 2:30pm        Sincerely, Teressa Lower RN  This letter has been electronically signed by your physician.

## 2010-05-25 NOTE — Assessment & Plan Note (Signed)
Summary: 2 wk f/u /tg   Visit Type:  Follow-up Primary Provider:  Dr.Fanta  CC:  nausea and abdominal pain and slowness to think.  History of Present Illness: [name] [name] is a 60 y/o CM we are seeing on follow-up with history of hypertension, nonobstructive CAD, hypercholesterolemia, gout, and hypothyroidism.  He was placed on caduet samples on last visit as he did not have money for medications at that time and was hypertensive, with elevated cholesterol 201, LDL 144.  He was also complaining of recurrent chest discomfort.  A stress test and echocardiogram were ordered as well.  He was unable to tolerate samples of caduet secondary to stomach upset and feeling generally bad.  He stopped taking it and felt better within the next 24 hours. He did not report these symptoms to [place] at the time. He has other somatic complaints of gout, diaphoresis, knee pain, GI disturbances and wt loss.  Current Medications (verified): 1)  Aspirin 81 Mg Tbec (Aspirin) .... Once Daily 2)  Synthroid 75 Mcg Tabs (Levothyroxine Sodium) .... Once Daily 3)  Garlic 500 Mg  Tabs (Garlic) .... Two Times A Day 4)  Norvasc 10 Mg Tabs (Amlodipine Besylate) .... Take 1 Tablet By Mouth Once Daily 5)  Indomethacin 50 Mg Caps (Indomethacin) .... Take As Needed  Allergies (verified): 1)  ! * Plavix 2)  ! Altace 3)  ! Uloric (Febuxostat) 4)  ! * Avorastatin.  Review of Systems       Nausea with avorastatin. All other systems have been reviewed and are negative unless stated above.   Vital Signs:  Patient profile:   60 year old male Weight:      259 pounds Pulse rate:   49 / minute BP sitting:   167 / 86  (right arm)  Vitals Entered By: Dreama Saa, CNA (December 22, 2009 3:21 PM)  Physical Exam  General:  Well developed, well nourished, in no acute distress. Lungs:  Clear bilaterally to auscultation and percussion. Heart:  Non-displaced PMI, chest non-tender; regular rate and rhythm, S1, S2 without murmurs, rubs  or gallops. Carotid upstroke normal, no bruit. Normal abdominal aortic size, no bruits. Femorals normal pulses, no bruits. Pedals normal pulses. No edema, no varicosities. Abdomen:  hyperactive BS Msk:  Back normal, normal gait. Muscle strength and tone normal. Pulses:  pulses normal in all 4 extremities Neurologic:  Alert and oriented x 3. Psych:  anxious.     Impression & Recommendations:  Problem # 1:  CORONARY ATHEROSCLEROSIS NATIVE CORONARY ARTERY (ICD-414.01) Review of stress myoview revealed good exercise capacity, no significant stress induced EKG abnormalities. No definite ischemia or infarction. His updated medication list for this problem includes:    Aspirin 81 Mg Tbec (Aspirin) ..... Once daily    Norvasc 10 Mg Tabs (Amlodipine besylate) .Marland Kitchen... Take 1 tablet by mouth once daily  Problem # 2:  HYPERTENSION (ICD-401.9) He is intolerant to the samples of caduet. I believe that the avorastatin caused the nausea as he has been on  norvasc in the past without reaction.  I have given him a Rx for Norvasc 10mg  daily.  He assures me that he can afford this medication in generic form.  He will return to the office in 3 days for BP check with the nurses and to discuss any symptoms. He is to call sooner if he has reaction to this medication. His updated medication list for this problem includes:     Aspirin 81 Mg Tbec (Aspirin) ..... Once  daily    Norvasc 10 Mg Tabs (Amlodipine besylate) .Marland Kitchen... Take 1 tablet by mouth once daily  Patient Instructions: 1)  Your physician recommends that you schedule a follow-up appointment on Friday for blood pressure check/nurse visit and in 3 months 2)  Your physician has recommended you make the following change in your medication: Stop taking Caduet and start taking Norvasc (Amlodipine) 10mg  by mouth once daily  Prescriptions: NORVASC 10 MG TABS (AMLODIPINE BESYLATE) take 1 tablet by mouth once daily  #30 x 3   Entered by:   Larita Fife Via LPN   Authorized by:    Joni Reining, NP   Signed by:   Larita Fife Via LPN on 16/01/9603   Method used:   Electronically to        [facility]  [address] (retail)       [address]       [place], [place]  [address]       Ph: [phone]       Fax: [phone]   RxID:   [id number]

## 2010-05-25 NOTE — Miscellaneous (Signed)
Summary: lipids,12/04/2009  Clinical Lists Changes  Observations: Added new observation of LDL: 144 mg/dL (40/98/1191 4:78) Added new observation of HDL: 38 mg/dL (29/56/2130 8:65) Added new observation of TRIGLYC TOT: 97 mg/dL (78/46/9629 5:28) Added new observation of CHOLESTEROL: 201 mg/dL (41/32/4401 0:27)

## 2010-07-07 LAB — COMPREHENSIVE METABOLIC PANEL
ALT: 17 U/L (ref 0–53)
AST: 25 U/L (ref 0–37)
Albumin: 4.1 g/dL (ref 3.5–5.2)
Alkaline Phosphatase: 53 U/L (ref 39–117)
BUN: 13 mg/dL (ref 6–23)
Chloride: 104 mEq/L (ref 96–112)
GFR calc Af Amer: >60 mL/min (ref >60)
Potassium: 3.3 mEq/L — ABNORMAL LOW (ref 3.5–5.1)
Sodium: 139 mEq/L (ref 135–145)
Total Bilirubin: 0.8 mg/dL (ref 0.3–1.2)
Total Protein: 7.7 g/dL (ref 6.0–8.3)

## 2010-07-07 LAB — DIFFERENTIAL
Basophils Relative: 1 % (ref 0–1)
Eosinophils Relative: 2 % (ref 0–5)
Monocytes Absolute: 0.4 10*3/uL (ref 0.1–1.0)
Monocytes Relative: 7 % (ref 3–12)
Neutro Abs: 2.4 10*3/uL (ref 1.7–7.7)

## 2010-07-07 LAB — URINALYSIS, ROUTINE W REFLEX MICROSCOPIC
Bilirubin Urine: NEGATIVE
Nitrite: NEGATIVE
Specific Gravity, Urine: 1.01 (ref 1.005–1.030)
Urobilinogen, UA: 0.2 mg/dL (ref 0.0–1.0)
pH: 7 (ref 5.0–8.0)

## 2010-07-07 LAB — URINE MICROSCOPIC-ADD ON: RBC / HPF: NONE SEEN RBC/hpf (ref <3)

## 2010-07-07 LAB — CBC
MCV: 92.8 fL (ref 78.0–100.0)
Platelets: 290 10*3/uL (ref 150–400)
RBC: 4.08 MIL/uL — ABNORMAL LOW (ref 4.22–5.81)
RDW: 13.3 % (ref 11.5–15.5)
WBC: 5.6 10*3/uL (ref 4.0–10.5)

## 2010-07-07 LAB — POCT CARDIAC MARKERS: CKMB, poc: 2.2 ng/mL (ref 1.0–8.0)

## 2010-09-07 NOTE — Letter (Signed)
April 01, 2008    Franchot Heidelberg, MD  [address], [address]  [place], [place] [address]   RE:  DEZMAN, GRANDA  MRN:  [id number]  /  DOB:  [birth year]   Dear Remi Haggard,   Mr. Aguinaldo returns to the office for continued assessment and treatment  of coronary artery disease, which was previously nonobstructive and  multiple cardiovascular risk factors.  Since his last visit, he has done  generally well.  He reports good exercise tolerance without dyspnea or  chest discomfort.  He has chronic edema that worsens during the day and  improves at night.  He has had no serious medical events over the past  year.  He does have gout, which is not currently bothering him.  He has  had some neck and shoulder discomfort that he attributes to arthritic  involvement of the cervical spine.   CURRENT MEDICATIONS:  1. Aspirin 81 mg daily.  2. Ramipril 10 mg daily.  3. Levothyroxine 0.05 mg daily.  4. Amlodipine 10 mg daily.   PHYSICAL EXAMINATION:  GENERAL:  On exam, pleasant, overweight  gentleman, in no acute distress.  VITAL SIGNS:  The weight is 290, 12 pounds more than in July 2009.  Blood pressure 130/75, heart rate 60 and regular, respirations 12 and  unlabored.  NECK:  No jugular venous distention; no carotid bruits.  LUNGS:  Clear.  CARDIAC:  Normal first and second heart sounds; modest systolic ejection  murmur.  ABDOMEN:  Soft and nontender; normal bowel sounds; no organomegaly.  EXTREMITIES:  Pitting 1-2+ pretibial edema.   IMPRESSION:  Mr. Consalvo is doing generally well.  Blood pressure  control is excellent, but he does have significant edema to which  amlodipine may be contributing.  We will start chlorthalidone 12.5 mg  daily and reduce amlodipine to 5 mg daily.  He may not need a calcium  channel antagonist at all.  I am reluctant to switch him to diltiazem  since he is already bradycardic.  Verapamil might work for him.   Although he did not  require coronary intervention, he had coronary focal  lesions of borderline significance.  His lipid profile should be better  than it has been in the past.  He has not tolerated simvastatin,  Vytorin, and Pravachol.  I will go to another class and start Niaspan  initially to dose of 500 mg working in her way to 1500 mg.  A chemistry  profile and lipid profile will be checked in 1 month.  If results are  good, I will reassess this nice gentleman in 3 months.    Sincerely,      Gerrit Friends. Dietrich Pates, MD, [facility]  Electronically Signed    RMR/MedQ  DD: 04/01/2008  DT: 04/02/2008  Job #: [id number]

## 2010-09-07 NOTE — Letter (Signed)
November 10, 2006    Franchot Heidelberg, M.D.  [address]  [place], [place]  [address]   RE:  [name] [name], [name] [name]  MRN:  [id number]  /  DOB:  26-Dec-1950   Dear Remi Haggard:   Thanks for sending [name] [name] back to [place].  He was previously a patient  of Dr. Marchelle Folks, but was lost to followup.  He has moderate coronary  disease, as identified during a catheterization in 2006 and multiple  cardiovascular risk factors.  He has had chest discomfort in the past,  but does not recall any recent episodes.  He is relatively active,  caring for his property, without significant problems.  He has noted  occasionally palpitations lasting a matter of seconds.  Blood pressure  control has reportedly been good.  Lipid control less.  Dr. Dorethea Clan felt  that [name] [name] had issues with compliance.  As I discussed cholesterol  management with him, it sounds as if he does not take his pravastatin  routinely, although he did fill a prescription within the past month.  He believes his statins cause headaches and malaise.  He also has  erectile dysfunction, but is not inclined to take Levitra due to  possible adverse effects.  His other medications include aspirin 81 mg  daily, ramipril 10 mg daily, levothyroxine 0.05 mg daily, amlodipine 10  mg daily.   PHYSICAL EXAMINATION:  A pleasant, overweight gentleman in no acute  distress.  The weight is 278, 1 pound less than 18 months ago.  Blood pressure  135/85, heart rate 55 and regular, respirations 16.  NECK:  No jugular venous distension, normal carotid upstrokes without  bruits.  LUNGS:  Clear.  CARDIAC:  Normal 1st and 2nd heart sounds, grade 2/6 basilar systolic  ejection murmur.  ABDOMEN:  Soft and nontender, normal bowel sounds, no masses, no  organomegaly.  EXTREMITIES:  1+ ankle and pretibial edema, distal pulses intact.   IMPRESSION:  [name] [name] is doing well from a symptomatic standpoint.  Blood pressure control is adequate  if not ideal.  He is experiencing  some edema, perhaps related to treatment from amlodipine.  This does not  trouble him and need not be a reason to modify his medical regimen.   Control of lipids was suboptimal in June when total cholesterol was in  excess of 200 with LDL above 140.  HDL was 35.  The patient is  interested in non-pharmaceutical treatments such as garlic.  You may  wish to consider a trial of a resin such as Welchol, laying the  groundwork by assuring him that it is different that the medicines he  has been taking, and very unlikely to cause adverse effects.  An  alternative approach would be to use multiple non-pharmaceutical agents  such as oats, margarine containing plant sterols, red yeast rice, etc.  Ultimately, the patient's attitude and tendency toward adverse drug  effects may preclude adequate treatment of his mild dyslipidemia.  I  will plan to reassess this nice gentleman in 1 year.    Sincerely,      Gerrit Friends. Dietrich Pates, MD, [facility]  Electronically Signed    RMR/MedQ  DD: 11/10/2006  DT: 11/11/2006  Job #: [id number]

## 2010-09-07 NOTE — Assessment & Plan Note (Signed)
NAME:  [name] [name], [name] [name]               CHART#:  [id number]   DATE:  03/07/2007                       DOB:  November 18, 1950   CHIEF COMPLAINT:  Constipation since colonoscopy.   HPI:  The patient is a 60 year old African American male.  He underwent  a screening colonoscopy by Dr. Jena Gauss on 02/19/07.  He was devoid of any  GI symptoms at that time.  Colonoscopy findings were normal.  Since his  colonoscopy, he noticed he was having hard stools with significant  amount of straining.  He was having a stool about every 2 to 3 days.  He  complained of a significant amount of abdominal bloating.  He had taken  an enema at home.  He did not get any relief.  Therefore, he presented  to [facility] Emergency Room on March 06, 2007.  He had  abdominal films which showed a mildly distended small bowel to mid  abdomen, scattered stool mainly in the right colon, no findings to  suggest a large stool burden or obstruction or ileus.  He was told to  begin MiraLax and twice daily stool softeners once he returned home.  He  did have a large bowel movement last night and felt good relief.  He  denies any rectal bleeding or melena.  He does have a history of  hypothyroidism.  He tells me he had his TSH checked last month and it  was within normal range.  He has noticed some increasing heartburn and  indigestion and has had symptoms a couple of times in the last 2 weeks  or so.  He is complaining of some proctalgia and some transient nausea  as well.  He tells me he did have occasional constipation prior to the  colonoscopy where he would have to take a stool softener or eat prunes.   PAST MEDICAL/SURGICAL HISTORY:  1. Colonoscopy as described in HPI.  2. Gout.  3. Erectile dysfunction.  4. Obesity.  5. Constipation.  6. Arrhythmia.  7. Osteoarthritis.  8. Hypothyroidism.  9. Hypertension.  10.Hyperlipidemia.  11.GERD.  12.Depression.  13.Coronary artery disease.  14.Asthma.  15.Anxiety.   CURRENT MEDICATIONS:  1. Norvasc 2.5 mg daily.  2. Zocor 20 mg daily.  3. Indomethacin once daily.  4. Synthroid 50 mcg daily.  5. Ramipril 10 mg daily.  6. MiraLax 17 g daily (patient has not started yet).  7. Levitra 20 mg as directed.  8. Over-the-counter stool softeners once or twice daily.   ALLERGIES:  1. PLAVIX.  2. CELEBREX.  3. ALTACE.   FAMILY HISTORY:  There is no known family history of colorectal CA or  chronic GI problems.  Mother is 38 and has history of Alzheimer's.  Father deceased at age 36 secondary to lung cancer.  He has 1 brother  with coronary artery disease.  Two sisters alive with diabetes mellitus.   SOCIAL HISTORY:  The patient is single.  He is unemployed due to  disability.  He denies any tobacco, alcohol, or drug use.   REVIEW OF SYSTEMS:  See HPI, otherwise negative.   PHYSICAL EXAM:  VITAL SIGNS:  Weight 285.  Height 75 inches.  Temp 98.2.  Blood pressure 124/88.  Pulse 64.  GENERAL:  The patient is a 60 year old, obese, African American male who  is alert, oriented, pleasant,  cooperative, and in no acute distress.  HEENT:  Pupils equal.  Sclerae clear.  Nonicteric.  Oropharynx pink and  moist without lesions.  NECK:  Supple without any masses or thyromegaly.  CHEST:  Heart, regular rate and rhythm.  Normal S1 and S2 without  murmurs, clicks, rubs, or gallops.  LUNGS:  Clear to auscultation bilaterally.  ABDOMEN:  Protuberant with positive bowel sounds x4.  No bruits  auscultated.  Soft, mildly distended, nontender, without any  hepatosplenomegaly or mass.  No rebound, tenderness, or guarding.  Exam  was limited given patient's body habitus.  EXTREMITIES:  Without clubbing or edema bilaterally.   IMPRESSION:  The patient is a 60 year old male who is just over 2 weeks  after having a normal colonoscopy by Dr. Jena Gauss.  He is having complaints  of significant constipation.  Plain films without evidence of  obstruction, ileus, or significant  stool retaining colon.  He had good  relief with an enema last night.   PLAN:  1. He is to begin Benefiber, FiberChoice, or fiber supplement of      choice once daily.  2. He can use MiraLax 17 g p.r.n. to promote bowel movement.  3. Over-the-counter stool softeners, Colace 100 mg 1 to 2 daily for      constipation.  4. He is to call us if he has any further problems.  5. Screening colonoscopy in 10 years or sooner if he has any problems.       Lorenza Burton, N.P.  Electronically Signed     R. Roetta Sessions, M.D.  Electronically Signed    KJ/MEDQ  D:  03/07/2007  T:  03/08/2007  Job:  [id number]

## 2010-09-07 NOTE — Op Note (Signed)
NAME:  [name] [name], [name] [name]              ACCOUNT NO.:  [id number]   MEDICAL RECORD NO.:  [id number]          PATIENT TYPE:  AMB   LOCATION:  DAY                           FACILITY:  [facility]   PHYSICIAN:  R. Roetta Sessions, M.D. DATE OF BIRTH:  06/17/1950   DATE OF PROCEDURE:  02/19/2007  DATE OF DISCHARGE:                               OPERATIVE REPORT   PROCEDURE:  Screening colonoscopy.   INDICATIONS FOR PROCEDURE:  A 60 year old African American gentleman  sent over at the courtesy Dr. Erby Pian for colorectal cancer screening,  had a colonoscopy done by Dr. several years ago without significant  findings.  [name] is devoid of any lower GI tract symptoms.  There  is no family history of colon cancer.  Colonoscopy is now being done as  screening maneuver.  This approach was discussed with the patient along  with potential risks, benefits and limitations.  His questions were  answered, he is agreeable.  Please see documentation on the medical  record.   PROCEDURE NOTE:  O2 saturation, blood pressure, pulse and respirations  were monitored throughout the entire procedure.   CONSCIOUS SEDATION:  Versed 4 mg IV, Demerol 75 mg IV in divided doses.   INSTRUMENT:  Pentax video chip system.   FINDINGS:  Digital rectal exam revealed no abnormalities.   ENDOSCOPIC FINDINGS:  The prep was good.   Colon:  Colonic mucosa was surveyed from the rectosigmoid junction  through the left, transverse, right colon to the area of appendiceal  orifice, ileocecal valve and cecum.  These structures were well seen and  photographed for the record.  The terminal ileum was then measured at 5  cm.  From this level, the scope slowly, cautiously withdrawn using  combination of tip deflection and fold flattening.  Mucosal surfaces of  the colon were meticulously evaluated.  The colonic mucosa as well as  terminal ileal mucosa appeared normal.  Scope was pulled down in the  rectum where thorough examination of  the rectal mucosa including  retroflexed view of the anal verge demonstrated no abnormalities.  The  patient tolerated the procedure well as reactive to endoscopy.   IMPRESSION:  Normal rectum, colon, terminal ileum.   RECOMMENDATIONS:  Repeat screening colonoscopy in 10 years.      Jonathon Bellows, M.D.  Electronically Signed     RMR/MEDQ  D:  02/19/2007  T:  02/19/2007  Job:  [id number]   cc:   Franchot Heidelberg, M.D.

## 2010-09-10 NOTE — Discharge Summary (Signed)
[place]  Patient:    [name] [name], [name] [name]                     MRN: [id number] Adm. Date:  [id number] Disc. Date: 10/24/00 Attending:  Talitha Givens Dictator:   Joellyn Rued, P.A.-C. CC:         Suszanne Conners. Katrinka Blazing, M.D., [place], [place]  Rollene Rotunda, M.D. [facility]   Referring Physician Discharge [place]  DATE OF BIRTH:  Feb 13, 1951  SUMMARY OF HISTORY:  [name] [name] is a 60 year old black male who presented to the emergency room after he had left-sided chest discomfort while at work. Discomfort was 2 on a scale of 0-to-10 and lasted less than 10 minutes.  He had two episodes on the evening prior to admission which he described as mild. One woke him up at 3:15 which was associated with anxiety, left arm numbness and shortness of breath and diaphoresis.  After the episode that he had that woke him up, he decided to come to the emergency room and he was placed on oxygen, aspirin and Lovenox and his symptoms gradually improved.  He has a history of gout and obesity.  LABORATORY DATA:  EKGs at [facility] showed sinus bradycardia, nonspecific ST-T wave changes.  CK at [facility] showed a total CK of 602 with MB of 4.8, troponin 0.02. Sodium 139, potassium 3.7, BUN 15, creatinine 1.2, glucose 104.  Normal LFTs. H&H 13.8 and 42.0, normal indices, platelets 379,000, WBC 5.0.  At [facility], fasting lipids showed a total cholesterol of 226, triglycerides 127, HDL 35, LDL 166.  Subsequent CKs and troponins were negative for myocardial infarction.  Subsequent chemistries and hematologies were unremarkable.  PT was 13.2 on [facility] admission, PTT 45, D-dimer 0.22.  Subsequent EKGs continued to show sinus bradycardia, biphasic T waves.  HOSPITAL COURSE:  [name] [name] was transferred to the unit 2000. Dr. Rollene Rotunda admitted the patient with EKG changes but felt that we needed to rule out obstructive coronary artery disease.  Catheterization was performed  on October 20, 2000.  According to Dr. Lindaann Slough progress notes, LV pressure was 172/30, aortic 170/86.  LAD had a mid 50% septal perforator lesion, 40% at the septal perforator.  There was an ostial 90% septal 1 lesion and a subtotal septal 2 lesion.  There was a mid 60-70% diagonal 1, a proximal 25% circumflex, 60% distal circumflex, diffuse luminal irregularities of the RCA with an EF of 65%.  Post sheath removal and bedrest, it was felt that after review, Dr. Antoine Poche felt that he had non-obstructive large vessel coronary artery disease with a high-grade lesion in the septal perforator with normal LV function.  A Cardiolite scan was performed to evaluate for possible ischemia.  Imaging did not show any signs of ischemia.  Dr. Arturo Morton. Stuckey added a statin and Plavix for aggressive medical treatment.  It was noted that telemetry did continue to show sinus bradycardia with a ventricular rate in the 30s and the 40s, however, he remained asymptomatic.  Dr. Riley Kill notes that the patient has also applied for disability for unknown reasons. Dr. Antoine Poche makes a note on October 23, 2000 to check an echo with Valsalva.  He also instructed the patient that there is a possible interaction of Zocor and antibiotics and to please keep that in mind and remind medical practitioners if he needs antibiotics.  Carotid Dopplers were performed and this showed mild irregularity, noncalcific plaques along the anterior  wall bifurcation of the right proximal ICA.  The left had very mild scattered noncalcific plaques and bifurcations in the area.  Vertebral flow was antegrade.  After reviewing, Dr. Noralyn Pick. Nishan felt that the patient could be discharged home.  DISCHARGE DIAGNOSES: 1. Chest discomfort of undetermined etiology. 2. Obesity. 3. Hypertension. 4. Hyperlipidemia.  DISPOSITION:  He is discharged home.  DISCHARGE MEDICATIONS: 1. Coated aspirin 325 mg p.o. q.d. 2. Plavix 75 mg p.o. q.d. 3. Altace  2.5 mg p.o. q.d. 4. Norvasc 2.5 mg p.o. q.d. 5. Zocor 20 mg q.h.s. 6. Sublingual nitroglycerin as needed. 7. He was instructed he could continue his gout medication.  ACTIVITIES:  He was given permission to return to work on Monday.  DIET:  Maintain low-salt/-fat/-cholesterol diet.  SPECIAL DISCHARGE INSTRUCTIONS:  If he had any problems with his catheterization site, he was asked to call.  FOLLOWUP:  He was instructed to bring all medications to the office, to follow up with Dr. Jerolyn Shin C. Katrinka Blazing in one to two weeks and he will see Dr. Antoine Poche in the [facility] office on July 26th at 10:30 a.m. DD:  10/24/00 TD:  10/24/00 Job: [id number]

## 2010-09-10 NOTE — H&P (Signed)
NAME:  [name] [name], [name] [name]                        ACCOUNT NO.:  [id number]   MEDICAL RECORD NO.:  [id number]                   PATIENT TYPE:  INP   LOCATION:  A319                                 FACILITY:  [facility]   PHYSICIAN:  Annia Friendly. Loleta Chance, M.D.                DATE OF BIRTH:  1950/09/21   DATE OF ADMISSION:  09/09/2003  DATE OF DISCHARGE:                                HISTORY & PHYSICAL   IDENTIFYING INFORMATION/JUSTIFICATION FOR ADMISSION AND CARE:  The patient  is a 60 year old single, disabled, black male from [place], [place].   CHIEF COMPLAINT:  Constipation.   HISTORY OF PRESENT ILLNESS:  The patient had been experiencing constipation  x2 days. He also complained of nausea. The patient took 2 tablespoons of  Milk of Magnesia for relief for constipation without success. He did  experience some lower abdominal cramping on the day of admission. The  patient came to the emergency room because of rectal discomfort. The nurses  indicated that the patient tried to remove stool manually. The patient  indicated that the rectal pain was increased after rectal examination by  emergency room physician. The emergency room physician related that the  stool was grossly positive. The patient had experienced an episode of rectal  bleeding while on Plavix in 2004.   PAST MEDICAL HISTORY:  Negative for known peptic ulcer disease,  diverticulosis, colon cancer, hemorrhoid disease, bleeding disorder, and  inflammatory bowel disease. Positive for hypertension and asthma. Negative  for diabetes, sickle cell, and seizure disorder. Positive for  hospitalization for evaluation of chest pain at [facility] in July  of 2002.   CARDIAC ASSESSMENT:  The patient underwent a left heart catheterization to  evaluate unstable angina. Cardiac catheterization was read as left anterior  descending. Has a mid 50% stenosis at the septal perforator. There was a 40%  stenosis at a second septal  perforator. The first septal perforator and  ostial 90% stenosis. The second septal perforator had a slow flow and was  sub-totally occluded. The large first diagonal had a ostial 50% to 60%  stenosis and a mid 60% to 70% stenosis. The circumflex was large but non-  dominant. There was approximately 15% stenosis. There was distal 50%  stenosis. The right coronary artery was a large dominant vessel. It had a  diffuse luminal irregularity. There was a 30% mid stenosis and a 25%  stenosis after the patent ductus arteriosus. The ventriculogram was obtained  in the RAO projection. The ejection fraction was 65% with a mild normal wall  motion. Conclusion was non-obstructive large vessel disease. Cardiology was  not clear on the hemodynamics of the diagonal lesion. 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   HABITS:  Negative for tobacco, ethanol or street drugs.   FAMILY HISTORY:  Mother living age 10 with history of hypertension; father  deceased in his 14's cause unknown. One brother  living age 25 with history  of coronary artery disease; two sisters living at age 67 with history of  diabetes and age 47 in good health.   REVIEW OF SYSTEMS:  Positive for episodic urinary hesitancy and multiple  joint pains and stiffness. Negative for weight loss, dysphagia, shortness of  breath, chronic cough, dysuria, gross hematuria, diarrhea, unexplained  fever, edema of legs, epistaxis, hemoptysis, night sweats, etc.   SEXUALLY TRANSMITTED DISEASES:  History negative for gonorrhea, syphilis,  herpes, and HIV infection.   PHYSICAL EXAMINATION:  VITAL SIGNS:  Temperature 99.2, pulse 75, respiratory  rate 20, blood pressure 175/95.  GENERAL:  A middle aged, large framed, overweight, medium height, black male  in no apparent respiratory distress.  SKIN:  Hot and dry. He had a positive male pattern baldness.  HEENT:  Ears demonstrated normal auricle. External canal was patent. Eyes  revealed no  ptosis of lids. Sclera were white. Pupils are equal, round, and  reactive to light. Nose was negative for discharge. Mouth revealed dentition  good. No bleeding gums. No oral lesions. Posterior pharynx benign.  NECK:  No lymphadenopathy or thyromegaly. No carotid bruits on auscultation.  Supraclavicular demonstrated no palpable nodes.  LUNGS:  Clear.  HEART:  Audible S1 and S2 without murmur. Rhythm was regular and rate was  within normal limits.  ABDOMEN:  Obese. Hypoactive bowel sounds. Soft. Demonstrated mid hypogastric  tenderness. Examination demonstrated no organomegaly or palpable masses.  GENITALIA:  Normal male. Penis was circumcised without lesion, discharge.  Scrotum demonstrated palpable testicles without nodule or tenderness.  RECTAL:  No external lesion. Digital examination was done by emergency room  physician.  EXTREMITIES:  No edema. No joint swelling. No joint redness. No joint  hotness. The patient had palpable femoral artery and dorsalis pedis  bilaterally.  NEUROLOGIC:  Intact.   LABORATORY DATA:  White count 7.0, hemoglobin 13.4, hematocrit 39.7.  Platelets 354,000. Sodium 136, potassium 3.6, chloride 103, CO2 29, glucose  115, BUN 10, creatinine 1.1. Total bilirubin 0.6. Albumin 4.0. Calcium 9.4.  Urinalysis (specific gravity 1.015, pH 7.0). No glucose and no bilirubin.  Small amount of protein. Nitrite negative. Leukocyte esterase negative.   PRIMARY IMPRESSION:  Constipation.   SECONDARY IMPRESSION:  1. Hypertension.  2. Abnormal cardiac catheterization.   PLAN:  IV Levaquin 500 mg IV q. 8 hours because of low grade temperature.  Gentamicin 100 mg IV x1. Repeat UA and culture and sensitivity after  prostate examination by emergency room physician. GoLYTELY 4 liters starting  at 0900 on Sep 10, 2003. Soap suds enema at 0600 on Sep 11, 2003. Diagnostic esophagogastroduodenoscopy and colonoscopy with Dr. Maggie Schwalbe. Altace 5 mg  p.o. every day. Diet, clear  liquids. Hold aspirin-like products. CEA level.  Erythrocyte sedimentation rate.     ___________________________________________                                         Annia Friendly. Loleta Chance, M.D.   Levonne Hubert  D:  09/09/2003  T:  09/10/2003  Job:  [id number]

## 2010-09-10 NOTE — Consult Note (Signed)
NAME:  [name] [name], [name] [name]              ACCOUNT NO.:  [id number]   MEDICAL RECORD NO.:  [id number]          PATIENT TYPE:  INP   LOCATION:  A216                          FACILITY:  [facility]   PHYSICIAN:  [place] Bing, M.D.  DATE OF BIRTH:  [birth year]   DATE OF CONSULTATION:  11/05/2004  DATE OF DISCHARGE:                                   CONSULTATION   REFERRING PHYSICIAN:  Vania Rea, M.D.   PRIMARY CARE PHYSICIAN:  Dr. Katrinka Blazing.  Primary cardiologist, Dr. Dorethea Clan.   HISTORY OF PRESENT ILLNESS:  A 60 year old gentleman readmitted to hospital  with recurrent dyspnea following recent cardiac catheterization.  Mr.  Barrell has known coronary disease dating back approximately 4 years when he  first underwent coronary angiography.  He was admitted to hospital earlier  this month after an abnormal stress Myoview study with ischemia in the  territory of the left anterior descending coronary artery.  Repeat coronary  angiography was similar to his prior study including borderline significant  lesions in the LAD and a large diagonal.  Intervention was not felt to be  warranted.  He was readmitted yesterday after developing dyspnea,  lightheadedness, nausea and eructation following, but not during, mild  exertion.  Symptoms resolved in the emergency department with the  administration of oxygen.  He reports no known lung disease and denies  wheezing.  Initial cardiac markers have been negative.  He has been  bradycardic with minor T-wave abnormalities on his EKG.   PAST MEDICAL HISTORY:  1.  History of hypertension that has been well treated.  2.  History of hyperlipidemia.  3.  He reports that Vytorin has caused palpitations.   ALLERGIES:  CELEBREX and CLOPIDOGREL.   MEDICATIONS:  1.  Ramipril 2.5 mg daily.  2.  Indomethacin 25 mg t.i.d.  3.  Vytorin one daily.  4.  Aspirin 81 mg daily.   SOCIAL HISTORY:  Disabled.  Lives in [place] alone.  Unmarried with no  children.  No use of  tobacco products or alcohol.  Relatively sedentary -  does do some gardening, but not heavy work.   FAMILY HISTORY:  Father is alive and has hypertension.  Mother died in her  38s due to lung cancer.  One brother suffered myocardial infarction in his  17s.  Two sisters are alive and well, but one has diabetes.   REVIEW OF SYSTEMS:  CONSTITUTIONAL:  Notable for intermittent chills.  GENITOURINARY:  Urinary frequency and nocturia.  MUSCULOSKELETAL:  Generalized weakness.  GASTROINTESTINAL:  GERD.   PHYSICAL EXAMINATION:  GENERAL:  Pleasant gentleman in no acute distress.  VITAL SIGNS:  Temperature is 97.4, heart rate 45 and regular, respirations  16, blood pressure 108/55, afebrile.  HEENT:  Anicteric sclerae; normal lids and conjunctivae.  NECK:  No jugular venous distension, right carotid bruits.  Normal carotid  upstrokes.  ENDOCRINE:  No thyromegaly.  HEMATOPOIETIC:  No cervical, axillary, inguinal adenopathy.  LUNGS:  Minimal expiratory rhonchi.  CARDIAC:  Normal first and second heart sounds; fourth heart sound present;  grade 2/6 nondescript systolic murmur at left sternal border.  ABDOMEN:  Soft and nontender; normal bowel sounds; no organomegaly.  EXTREMITIES:  1/2+ ankle edema on the left; distal pulses intact.  NEUROLOGIC:  Symmetric strength and tone; normal cranial nerves.  MUSCULOSKELETAL:  No joint deformities.   LABORATORY DATA AND X-RAY FINDINGS:  Initial lab acute tori studies  unremarkable except for a total CK of 717 with normal MB and normal  troponin.  BNP was normal.   IMPRESSION:  Mr. Demartini' symptoms are atypical and not clearly related to  his known cardiovascular disease.   RECOMMENDATIONS:  His bradycardia could be contributor, but has not been  commented upon in the past.  Other considerations are gastroesophageal  reflux disease with reflux induced asthma or other gastrointestinal,  unsuspected lung disease or pulmonary embolism.  A D-dimer level will  be  obtained.  We will also proceed with a stress echocardiogram to determine if  this test verifies ischemia in the distribution of the LAD.  Exercise  tolerance and chronotropic response will also be assessed.  If these studies  are negative, he can be discharged with impaired treatment for GERD and  sublingual nitroglycerin and additional testing including PFTs as an  outpatient.       RR/MEDQ  D:  11/05/2004  T:  11/05/2004  Job:  [id number]

## 2010-09-10 NOTE — Procedures (Signed)
NAME:  [name] [name], [name] [name]              ACCOUNT NO.:  [id number]   MEDICAL RECORD NO.:  [id number]          PATIENT TYPE:  REC   LOCATION:                                FACILITY:  [facility]   PHYSICIAN:  Pricilla Riffle, M.D.    DATE OF BIRTH:  1950-11-11   DATE OF PROCEDURE:  DATE OF DISCHARGE:                                    STRESS TEST   BRIEF HISTORY:  [name] is a 60 year old male with coronary artery  disease. He had a cath in 2002 that revealed 50% LAD lesion, 50% circumflex  lesion and 30% RCA lesion. He had normal ejection fraction at that time. He  also had a normal Cardiolite at that time. He now presents with complaints  of dyspnea on exertion.   BASELINE DATA:  Electrocardiogram reveals a sinus rhythm at 49 beats per  minute with inverted T-waves in the inferolateral leads that are consistent  with his EKG done in 2002. Blood pressure is 142/70.   Patient exercised for a total of 6 minutes and 35 seconds, Bruce protocol  stage III and 7.0 METS. Maximal heart rate was 143 beats a minute which is  87% of predicted maximum. Maximal blood pressure is 180/80 and resolved down  to 128/76 in recovery. Electrocardiogram revealed few PVCs, baseline  abnormal electrocardiogram with no significant change with exercise. The  patient reported dizziness with exercise at the end that resolved.   Final images results are pending M.D. review. This is a two-day test.      Amy B   AB/MEDQ  D:  10/21/2004  T:  10/21/2004  Job:  [id number]

## 2010-09-10 NOTE — Discharge Summary (Signed)
NAME:  [name] [name], [name] [name]                        ACCOUNT NO.:  [id number]   MEDICAL RECORD NO.:  [id number]                   PATIENT TYPE:  INP   LOCATION:  A319                                 FACILITY:  [facility]   PHYSICIAN:  Annia Friendly. Loleta Chance, M.D.                DATE OF BIRTH:  Jul 05, 1950   DATE OF ADMISSION:  09/09/2003  DATE OF DISCHARGE:                                 DISCHARGE SUMMARY   The patient is a 60 year old, single, disable black male  from [place],  [place].   CHIEF COMPLAINT:  Constipation.   HISTORY:  The patient had been experiencing constipation x2 days. He also  complained of nausea.  The patient took 2 tablespoonfuls of milk of magnesia  for relief of constipation without success.  He did experience some lower  abdominal cramping on the day of admission.  The patient came to the  emergency room because of rectal discomfort.  The nurses indicated the  patient tried to remove stool manually without success.  The patient  indicated that the rectal pain was increased after rectal exam by the  emergency room physician.  The emergency room physician related that the  stool was grossly positive. The patient had experienced an episode of rectal  bleeding while on Plavix in 2004.   MEDICAL HISTORY:  Medical history was negative for known peptic ulcer  disease, diverticulosis, colon cancer, hemorrhoidal disease, bleeding  disorder, and inflammatory bowel disease.  Medical history was positive for  hypertension and asthma.   HABITS:  Habits were negative for tobacco, ethanol, and street drugs.   The patient denied excessive use of aspirin-like products.  Moreover the  history was negative for trauma to the rectal area.   FAMILY HISTORY:  1. Family history revealed mother living, age 62, with history of     hypertension.  2. Father deceased in his 52s, cause unknown.  3. One brother living, age 62, with history of coronary artery disease.  4. Two sisters,  living, age 23 with a history of diabetes and age 59 in good     health.   HOSPITAL COURSE BY PROBLEMS:  Problem #1:  CONSTIPATION.  On physical exam  general appearance was that of a middle-age, over-weight, large-framed,  black male in no apparent respiratory distress.  Abdomen was obese with  hypoactive  bowel sounds.  Abdomen was soft and demonstrated midhypogastric  tenderness.  Examination demonstrated no organomegaly or palpable masses.  Rectal exam was performed by emergency room physician.   The patient was admitted for IV fluid, clear liquid diet, diagnostic  colonoscopy and EGD with GoLYTELY.  The patient experienced significant  large bowel movement within the first 8 hours of admission.  He was given  GoLYTELY in preparation for diagnostic coloscopy.  The patient felt  significantly better within 24 hours after admission.  He had a diagnostic  colonoscopy on Sep 11, 2003 without complications.  The procedure was  performed by Dr. Maggie Schwalbe.  The purpose of the procedure and complications  were discussed with the patient before undergoing the procedure.  Total  colonoscopy demonstrated 2 small superficial ulcerations of the rectum  probably due to constipation and stercoraceous ulcer.  EGD was normal. The  patient was discharged home.  He was advised to take Metamucil in one 8-  ounce glass of water every day; increase fiber in diet and drink 8 glasses  of water daily.   Problem #2:  HYPERTENSION.  Blood pressure on admission was 175/95.  Lungs  were clear.  Heart revealed audible S1-S2 without murmur.  Rhythm was  regular and rate within normal limits.  Extremities demonstrated no edema.  X-ray of the abdomen including chest demonstrated no active cardiopulmonary  disease.  The heart was normal size. Both lungs were clear and there was no  evidence of pleural effusion and no mass or adenopathy identified.  There  was no change compared with previous Chest x-ray on 10/20/2000.   Blood  pressure was treated with Altace 2.5 mg p.o. every day.  Norvasc was held.  Blood pressure reading showed a steady decline.  Heart rate also showed a  decline.  The patient was asymptomatic.  Blood pressure on the morning of  discharge was 105/58, pulse of 53.  EKG demonstrated marked sinus  bradycardia with a rate of 48 on Sep 11, 2003 at 11:08.  The patient was not  complaining of shortness of breath, dizziness upon walking in the room, or  orthopnea.  Heart rate was then monitored closely as an outpatient.  Beta  blocker and calcium channel blocker that affects heart rate will be avoided.   Problem #3:  ABNORMAL CATHETERIZATION.  The patient underwent a left heart  catheterization of an unstable angina.  Cardiac catheterization was read as  left anterior descending with a mid 50% stenosis at the septal perforator.  There was a 40% stenosis at a second septal perforator.  The first septal  perforator and ostial 90% stenosis.  The second septal perforator had a slow  flow and was subtotally occluded.  The large first diagonal had an ostial  50% to 60% stenosis and a mid-60% to 70% stenosis.  The circumflex was  large, but nondominant.  There was approximately 15% stenosis.  There was  distal 50% stenosis.  The right coronary artery was a large dominant vessel.  It had a diffuse luminal irregularity.  There was a 30% midstenosis and a  25% stenosis after the patent ductus arteriosus.  The ventriculogram was  obtained in the RAO projection.  The ejection fraction was 65% with a mild  normal wall motion.  Conclusion was nonobstructive, large vessel disease.  Cardiology was not clear on the hemodynamics of the diagonal lesion.  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 The patient will follow up with  his cardiologist as an outpatient.   DISCHARGE INSTRUCTIONS:  1. Diet at the time of discharge:  Low salt, regular. 2. Activity:  As tolerated.  3. Follow up in office in 1  week.   DISCHARGE MEDICATIONS:  1. Altace 2.5 mg p.o. every day.  2. Metamucil 1 tablespoonful in 8 ounces of water daily.   FINAL PRIMARY DIAGNOSES:  1. Constipation.  2. A few small superficial ulcerations of rectum probably secondary to     constipation.   SECONDARY DIAGNOSES:  1. Hypertension.  2. 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  3.  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   SIGNIFICANT LABS ON ADMISSION:  White count 7.0, hemoglobin 13.4, hematocrit  39.7, platelets 354,000, erythrocyte sedimentation rate 5.  Sodium 136,  potassium 3.6, chloride 103, CO2 29, glucose 115, BUN 10, creatinine 1.1,  calcium 9.4, total protein 7.6, albumin 4.0, AST 27, ALT 25, ALP 55, total  bilirubin 0.6, lipase 17.  CEA level less than 0.5.  Urinalysis:  Specific gravity 1.015, pH 7.0, no  glucose, small amount of hemoglobin, no bilirubin, negative for protein and  nitrate negative, rbc's 0-2 and leukocyte esterase negative.  Urine culture  was negative for growth after 1 day.     ___________________________________________                                         Annia Friendly. Loleta Chance, M.D.   Levonne Hubert  D:  09/11/2003  T:  09/12/2003  Job:  [id number]

## 2010-09-10 NOTE — Cardiovascular Report (Signed)
NAME:  Findling, Cabell              ACCOUNT NO.:  [id number]   MEDICAL RECORD NO.:  [id number]          PATIENT TYPE:  OIB   LOCATION:  6501                         FACILITY:  [facility]   PHYSICIAN:  Rollene Rotunda, M.D.   DATE OF BIRTH:  1950-07-23   DATE OF PROCEDURE:  10/27/2004  DATE OF DISCHARGE:                              CARDIAC CATHETERIZATION   PRIMARY:  Jerolyn Shin C. Katrinka Blazing, M.D.   CARDIOLOGIST:  Vida Roller, M.D.   PROCEDURES:  Left heart catheterization/coronary arteriography.   INDICATIONS:  Evaluate a patient with atypical chest pain and an abnormal  Cardiolite.  The Cardiolite was read by the radiologist as potential  multivessel disease.  There was ischemia involving the anteroseptal and  lateral walls.  There appeared to be cavitary dilatation.  The EF was 57%.   PROCEDURE NOTE:  Left heart catheterization was performed via the right  femoral artery.  The artery was cannulated using anterior wall puncture.  A  #4 French arterial sheath was inserted via the modified Seldinger technique.  Preformed Judkins and a pigtail catheter were utilized.  The patient  tolerated the procedure well and left the laboratory in stable condition.   RESULTS:  Hemodynamics:  LV 131/21, AO 130/97.   Coronaries:  The left main was normal.  The LAD had proximal tandem 50%  lesions after a large diagonal.  There was a mid 70% stenosis.  The first  diagonal was large with ostial 50% stenosis and proximal 60% stenosis.  The  circumflex and the AV groove had multiple 25% proximal lesions.  There was a  50% stenosis in the mid vessel and 50% more distal after mid obtuse  marginal.  The mid obtuse marginal was a moderate sized branching vessel and  was normal.  The right coronary artery was dominant.  There were diffuse  proximal and luminal irregularities.  There was mid 40% stenosis.  There was  a long mid 30% stenosis.  There was distal 40% stenosis.  The PDA was  moderate size and normal.   The posterolateral was small and normal.   Left ventriculogram:  The left ventriculogram was obtained in the RAO  projection.  The EF was 60%.   CONCLUSION:  Diffuse coronary artery disease.  The LAD mid lesion was  moderately high grade.  Other disease did not appear to be obstructive.  I  did review and concur this to the 2002 procedure.  There was progression,  particularly in the mid LAD and diagonal.  However, the proximal LAD and  other vessels appeared to be relatively unchanged.   PLAN:  I will have Dr. Jens Som review the Cardiolite.  These results do not  correlate with what I am seeing visually.  The symptoms are vague.  At this  point, multivessel angioplasty or bypass would not seem to be indicated.  If there appeared to be apical ischemia, we could management the mid LAD  lesion percutaneously.  The patient needs aggressive risk reduction.  He had  not participated in this previously and needs to be encouraged.  We will  have him follow with Dr.  Dorethea Clan and Dr. Katrinka Blazing.       JH/MEDQ  D:  10/27/2004  T:  10/27/2004  Job:  [id number]   cc:   Dirk Dress. Katrinka Blazing, M.D.  [address]  [place]  [place] [address]  Fax: [phone]   Vida Roller, M.D.  Fax: [phone]

## 2010-09-10 NOTE — Discharge Summary (Signed)
NAME:  [name] [name]              ACCOUNT NO.:  [id number]   MEDICAL RECORD NO.:  [id number]          PATIENT TYPE:  INP   LOCATION:  A216                          FACILITY:  [facility]   PHYSICIAN:  [name] Shipper, MD     DATE OF BIRTH:  August 12, 1950   DATE OF ADMISSION:  11/04/2004  DATE OF DISCHARGE:  07/14/2006LH                                 DISCHARGE SUMMARY   DISCHARGE DIAGNOSES:  1.  Chest pain, likely noncardiac.  2.  Coronary artery disease.  3.  Chronic bradycardia.   Please see H&P dictated by Dr. Orvan [name] for details regarding the patient's  presenting illness.   BRIEF HOSPITAL COURSE:  This is a 60 year old African-American male with a  past history of coronary artery disease with a recent cardiac cath done on  July 5th, which showed diffuse coronary artery disease; however, none of  these were obstructive, and he did not undergo angioplasty or stent  placement.  Patient presented to the hospital with a sudden onset of  dizziness associated with abdominal pain and jerking movements of the legs.  He was also complaining of frequent episodes of awakening from sleep feeling  short of breath, suggesting PND.  Patient was admitted to the hospital for  further management.  Serial cardiac enzymes were checked on this patient.  Total CK was mildly elevated with a mild elevation of CK-MB.  His troponins  were all negative.  His B-type natriuretic peptide was less than 30.  The  patient's D-dimer was 0.40, which was within normal range.  Subsequently,  the patient was seen by [facility], Dr. Dietrich [name].  He recommended  the patient undergo a stress echo, which he did.  Stress echo, per Dr.  Dietrich [name], is normal.  (Final report is pending).  Based on this, the patient  did not need any further interventions urgently, per Dr. Dietrich [name].  Patient  needs to follow up with Dr. Dorethea [name] to see if any of his known coronary  artery disease needs to be fixed interventionally.  The patient  also had  carotid Dopplers for a bruit that was heard on physical examination.  The  Doppler study did not suggest any stenosis.  We have added a PPI to the  patient's medical regimen.  It appears that the patient may need to have a  sleep study and pulmonary function test to rule out sleep apnea and COPD,  which could cause his other symptoms of shortness of breath.   DISCHARGE MEDICATIONS:  We have just added Protonix 40 mg daily to his  medication regimen.  Otherwise, no further changes were made to his  medication regimen (See H&P).   Patient will resume his other diet and physical activity as before.  Followup needs to be with Dr. Dorethea [name] in the next 1-2 weeks.   FOLLOW UP:  1.  Patient needs to call Dr. Marchelle [name] office to make an appointment.  2.  We will try to set up a pulmonary function test and a sleep study for      this patient.   Studies done during this admission include a  carotid Doppler, as above, and  a stress echo, which preliminarily suggests normal function of the heart.  Final report is still pending.       GK/MEDQ  D:  11/05/2004  T:  11/05/2004  Job:  [id number]   cc:   [name] [name], M.D.  Fax: [phone]   [name] [name], M.D.   [name] Friendly. Loleta Chance, MD  [address]  [place]  [place] [address]  Fax: [phone]

## 2010-09-10 NOTE — Procedures (Signed)
NAME:  [name] [name], [name] [name]              ACCOUNT NO.:  [id number]   MEDICAL RECORD NO.:  [id number]          PATIENT TYPE:  INP   LOCATION:  A216                          FACILITY:  [facility]   PHYSICIAN:  [place] Bing, M.D.  DATE OF BIRTH:  [birth year]   DATE OF PROCEDURE:  11/05/2004  DATE OF DISCHARGE:  11/05/2004                                  ECHOCARDIOGRAM   PROCEDURE:  Stress echocardiogram.   CARDIOLOGIST:  Sallis Bing, M.D.   REFERRING PHYSICIANS:  Dr. Orvan Falconer and Dr. Dorethea Clan.   CLINICAL DATA:  A 60 year old gentleman with moderate coronary artery  disease on recent cardiac catheterization presenting with recurrent chest  fullness and dyspnea.   RESULTS:  1.  Treadmill exercise performed to a workload of 10 METS and heart rate of      141, 86% of age - predicted maximum.  Exercise discontinued due to      fatigue; dyspnea also noted; no chest pain.  2.  Blood pressure increased from a resting value of 105/75 to 185/90 at      peak exercise, a normal response.  3.  No significant arrhythmias identified.  Sinus bradycardia was present at      baseline.  4.  EKG: Sinus bradycardia at a rate of 42; junctional escape beats with      aberrancy.  5.  Stress EKG:  No significant change.  6.  Baseline echocardiogram: Normal left ventricular size; borderline      hypertrophy; normal regional and global function.  7.  Post - exercise echocardiogram:  Hyperdynamic function in all segments.   IMPRESSION:  Negative and adequate stress echocardiogram revealing no  electrocardiographic nor echocardiographic evidence for myocardial ischemia  or infarction; adequate exercise tolerance; other findings as noted.       RR/MEDQ  D:  11/07/2004  T:  11/08/2004  Job:  [id number]

## 2010-09-10 NOTE — H&P (Signed)
NAME:  [name] [name], [name] [name]              ACCOUNT NO.:  [id number]   MEDICAL RECORD NO.:  [id number]          PATIENT TYPE:  INP   LOCATION:  A216                          FACILITY:  [facility]   PHYSICIAN:  Vania Rea, M.D. DATE OF BIRTH:  05-19-1950   DATE OF ADMISSION:  11/05/2004  DATE OF DISCHARGE:  LH                                HISTORY & PHYSICAL   PRIMARY CARE PHYSICIAN:  Annia Friendly. Loleta Chance, M.D.   DICTATING PHYSICIAN:  Vania Rea, M.D.   CHIEF COMPLAINT:  1.  Episodic chest discomfort and shortness of breath during sleep.  2.  Presyncopal episode.   HISTORY OF PRESENT ILLNESS:  This is a 60 year old African-American  gentleman who has been on disability now for some 3 years because of easy  fatigability, chest discomfort, and dyspnea on exertion.  The patient has  had an extensive cardiac evaluation including cardiac catheterization last  week, which revealed moderate disease of multiple coronary arteries, most  notably a 70% mid stenosis of the LAD, but who was not considered suitable  for further intervention before trial of aggressive risk reduction.  The  patient also has a history of hyperlipidemia, hypertension, and gout, and  seems to have intrinsic bradycardia.  Since the patient's cardiac  catheterization, the only change from his baseline is that he has had  frequent episodes of awakening from sleep feeling very short of breath.  The  shortness of breath is relieved by sitting up in bed for a few minutes.  Then, when he lays down again, he falls asleep, and after a certain time he  wakes up again short of breath.  Today, the patient had an episode of sudden  dizziness associated with abdominal pain and jerking movements of the legs.  He felt as if he was going to pass out, but did not pass out completely.  Everything seemed far away, and then the patient suddenly came back.  He  decided to come to the emergency room with his sisters, and the emergency  room  physician felt that he should be admitted for further work-up, and the  patient was sent to the ward.   The patient denies fever, cough, cold, headache, frank chest pains, or  shortness of breath.  He has been having a feeling as if there is a bubble  in his chest, particularly at night.  He has marked dyspnea on exertion and  episodic swelling of his lower extremities.  He has never been told he has  congestive heart failure.  He has no vomiting, diarrhea, constipation,  bloody stool, or urinary disturbance.  He has no joint pains recently.  He  takes Endocet regularly, and has no problems with his stomach.  He was  recently started on Vytorin, but has discontinued it because he says it  gives him palpitations.   PAST MEDICAL HISTORY:  1.  Hypertension.  2.  Hyperlipidemia.  3.  Coronary artery disease.  4.  Chronic bradycardia.  5.  Gout.  6.  Obesity.   MEDICATIONS:  1.  Altace 2.5 mg daily.  2.  Indomethacin 25 mg  three times daily.  3.  Vytorin.  He started about a week ago, but has not discontinued it.  4.  Aspirin 81 mg daily.   ALLERGIES:  No known drug allergies.   SOCIAL HISTORY:  Lives alone.  He is unmarried with no children.  He denies  tobacco, alcohol, or illicit drug use.  He is on disability, as noted above.   FAMILY HISTORY:  Significant for mother living in her late 20s with  hypertension and Alzheimer's disease.  Father died in his 32s with CA of the  lung, and also had hypertension.  One brother as coronary artery disease,  and had his first MI in his early 26s.  Two sisters in their 63s; one with  diabetes, the other in good health.   REVIEW OF SYSTEMS:  On a 10-point review of systems, there are no problems  other than noted in the admission history and physical.   PHYSICAL EXAMINATION:  GENERAL:  A very pleasant, calm, and deliberate  overweight African-American man sitting up in bed in no distress.  VITAL SIGNS:  He is afebrile.  His pulse is 49,  respirations 16, blood  pressure 134/71.  He is having no pain at present.  He is saturating 100% on  room air.  HEENT:  Pupils are round and equal.  Mucous membranes are pink and  anicteric.  He has no lymphadenopathy, no jugular venous distention, no  thyromegaly.  CHEST:  Clear to auscultation bilaterally.  CARDIOVASCULAR:  Bradycardic, regular rhythm.  ABDOMEN:  Obese, soft, and nontender.  EXTREMITIES:  He has a trace edema bilaterally and 2+ pulses bilaterally.  CENTRAL NERVOUS SYSTEM:  He is alert and oriented x3.  Has no focal  neurologic deficit.   LABORATORY DATA:  His white count is 6.3, hemoglobin 13.2.  His platelets  are 271.  Sodium 142, potassium 3.5, chloride 111, CO2 of 23, glucose 101,  BUN 14, creatinine 1.5, calcium 9.1.  His point of care markers show a  mildly elevated myoglobin, but otherwise unremarkable.  His beta type  natruretic peptide is less than 30.  His chest x-ray shows no acute  abnormalities.  His EKG shows normal sinus rhythm with a rate of 54, and  nonspecific inferior T wave abnormalities.   ASSESSMENT:  A middle-aged gentleman with multiple risk factors for coronary  artery disease and a recent abnormal coronary angiographic study who  presents with symptoms suggestive of paroxysmal nocturnal dyspnea, but which  could also be related to obstructive sleep apnea, since the gentleman is a  very slow historian.  it may be  wise to get a cardiologist to review his current status prior to discharge.  He may at some time in the future also benefit from a GI evaluation and a  sleep study.  Will keep him on observation on a rule out myocardial  infarction protocol, and will have the cardiologist see him in the morning.       LC/MEDQ  D:  11/05/2004  T:  11/05/2004  Job:  [id number]

## 2010-09-10 NOTE — Cardiovascular Report (Signed)
[place]  Patient:    [name] [name], [name] [name]                     MRN: [id number] Proc. Date: 10/20/00 Adm. Date:  [id number] Attending:  Talitha Givens                        Cardiac Catheterization  DATE OF BIRTH:  10/03/1950  PRIMARY PHYSICIAN:  Elpidio Anis, M.D.  PROCEDURE:  Left heart cardiac catheterization/coronary arteriography.  INDICATIONS:  Evaluate patient with unstable angina and an ECG with anterior T wave inversion.  DESCRIPTION OF PROCEDURE:  Left heart catheterization was performed via the right femoral artery.  The artery was cannulated using an anterior wall puncture.  A #6 French arterial sheath was inserted via the modified Seldinger technique.  Preformed Judkins and a pigtail catheter were utilized.  The patient tolerated the procedure well and left the lab in stable condition.  RESULTS:  HEMODYNAMICS:  LV 172/30, AO 171/86.  CORONARY ARTERIOGRAPHY:  Left main:  The left main coronary artery was normal.  Left anterior descending:  The LAD had a mid 50% stenosis at its septal perforator.  There was a 40% stenosis at a second septal perforator.  The first septal perforator had ostial 90% stenosis.  The second septal perforator had a slow flow and was possibly subtotally occluded.  The large first diagonal had an ostial 50-60% stenosis and a mid 60-70% stenosis.  Circumflex:  The circumflex was large but nondominant.  There was proximal 25% stenosis.  There was distal 50% stenosis.  Right coronary artery:  The right coronary artery was large dominant vessel. It had diffuse luminal irregularities.  There was a 30% mid stenosis and 25% stenosis after the PDA.  LEFT VENTRICULOGRAM:  The left ventriculogram was obtained in the RAO projection.  The EF was approximately 650% with mild normal wall motion.  CONCLUSION:  Nonobstructive large vessel disease.  I am not clear on the hemodynamic significance of the diagonal  lesion.  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  PLAN:  The patient will have a Persantine Cardiolite to further evaluate the hemodynamic significance of the diagonal lesion.  He will have aggressive secondary risk factor modification. DD:  10/20/00 TD:  10/20/00 Job: [id number]

## 2010-10-02 ENCOUNTER — Emergency Department (HOSPITAL_COMMUNITY): Payer: No Typology Code available for payment source

## 2010-10-02 ENCOUNTER — Emergency Department (HOSPITAL_COMMUNITY)
Admission: EM | Admit: 2010-10-02 | Discharge: 2010-10-02 | Disposition: A | Discharge status: Home / Self Care | Payer: No Typology Code available for payment source | Attending: Emergency Medicine | Admitting: Emergency Medicine

## 2010-10-02 DIAGNOSIS — M542 Cervicalgia: Secondary | ICD-10-CM | POA: Insufficient documentation

## 2010-10-02 DIAGNOSIS — E039 Hypothyroidism, unspecified: Secondary | ICD-10-CM | POA: Insufficient documentation

## 2010-10-02 DIAGNOSIS — Y92410 Unspecified street and highway as the place of occurrence of the external cause: Secondary | ICD-10-CM | POA: Insufficient documentation

## 2010-10-02 DIAGNOSIS — I2510 Atherosclerotic heart disease of native coronary artery without angina pectoris: Secondary | ICD-10-CM | POA: Insufficient documentation

## 2010-10-02 DIAGNOSIS — I10 Essential (primary) hypertension: Secondary | ICD-10-CM | POA: Insufficient documentation

## 2011-04-25 ENCOUNTER — Encounter: Payer: Self-pay | Admitting: Cardiology

## 2011-07-24 ENCOUNTER — Emergency Department (HOSPITAL_COMMUNITY)
Admission: EM | Admit: 2011-07-24 | Discharge: 2011-07-24 | Disposition: A | Discharge status: Home / Self Care | Payer: Medicare Other | Attending: Emergency Medicine | Admitting: Emergency Medicine

## 2011-07-24 ENCOUNTER — Emergency Department (HOSPITAL_COMMUNITY): Payer: Medicare Other

## 2011-07-24 ENCOUNTER — Encounter (HOSPITAL_COMMUNITY): Payer: Self-pay

## 2011-07-24 DIAGNOSIS — R05 Cough: Secondary | ICD-10-CM | POA: Insufficient documentation

## 2011-07-24 DIAGNOSIS — R059 Cough, unspecified: Secondary | ICD-10-CM | POA: Insufficient documentation

## 2011-07-24 DIAGNOSIS — I10 Essential (primary) hypertension: Secondary | ICD-10-CM | POA: Insufficient documentation

## 2011-07-24 DIAGNOSIS — E039 Hypothyroidism, unspecified: Secondary | ICD-10-CM | POA: Insufficient documentation

## 2011-07-24 DIAGNOSIS — R609 Edema, unspecified: Secondary | ICD-10-CM | POA: Insufficient documentation

## 2011-07-24 DIAGNOSIS — R070 Pain in throat: Secondary | ICD-10-CM | POA: Insufficient documentation

## 2011-07-24 DIAGNOSIS — R509 Fever, unspecified: Secondary | ICD-10-CM | POA: Insufficient documentation

## 2011-07-24 DIAGNOSIS — Z7982 Long term (current) use of aspirin: Secondary | ICD-10-CM | POA: Insufficient documentation

## 2011-07-24 DIAGNOSIS — M79609 Pain in unspecified limb: Secondary | ICD-10-CM | POA: Insufficient documentation

## 2011-07-24 DIAGNOSIS — M109 Gout, unspecified: Secondary | ICD-10-CM | POA: Insufficient documentation

## 2011-07-24 DIAGNOSIS — I252 Old myocardial infarction: Secondary | ICD-10-CM | POA: Insufficient documentation

## 2011-07-24 DIAGNOSIS — K589 Irritable bowel syndrome without diarrhea: Secondary | ICD-10-CM | POA: Insufficient documentation

## 2011-07-24 DIAGNOSIS — I2510 Atherosclerotic heart disease of native coronary artery without angina pectoris: Secondary | ICD-10-CM | POA: Insufficient documentation

## 2011-07-24 LAB — CBC
HCT: 38.9 % — ABNORMAL LOW (ref 39.0–52.0)
Hemoglobin: 13.1 g/dL (ref 13.0–17.0)
MCH: 30 pg (ref 26.0–34.0)
MCHC: 33.7 g/dL (ref 30.0–36.0)
MCV: 89 fL (ref 78.0–100.0)
Platelets: 304 10*3/uL (ref 150–400)
RBC: 4.37 MIL/uL (ref 4.22–5.81)
RDW: 12.5 % (ref 11.5–15.5)
WBC: 8.1 10*3/uL (ref 4.0–10.5)

## 2011-07-24 LAB — BASIC METABOLIC PANEL
CO2: 25 mEq/L (ref 19–32)
Glucose, Bld: 167 mg/dL — ABNORMAL HIGH (ref 70–99)
Potassium: 3.3 mEq/L — ABNORMAL LOW (ref 3.5–5.1)
Sodium: 135 mEq/L (ref 135–145)

## 2011-07-24 LAB — URINE MICROSCOPIC-ADD ON

## 2011-07-24 LAB — DIFFERENTIAL
Eosinophils Absolute: 0 10*3/uL (ref 0.0–0.7)
Lymphs Abs: 2.3 10*3/uL (ref 0.7–4.0)
Monocytes Absolute: 0.8 10*3/uL (ref 0.1–1.0)
Monocytes Relative: 10 % (ref 3–12)
Neutro Abs: 4.9 10*3/uL (ref 1.7–7.7)
Neutrophils Relative %: 61 % (ref 43–77)

## 2011-07-24 LAB — URINALYSIS, ROUTINE W REFLEX MICROSCOPIC
Bilirubin Urine: NEGATIVE
Glucose, UA: NEGATIVE mg/dL
Ketones, ur: NEGATIVE mg/dL
Leukocytes, UA: NEGATIVE
Nitrite: NEGATIVE
Protein, ur: NEGATIVE mg/dL
Specific Gravity, Urine: 1.01 (ref 1.005–1.030)
Urobilinogen, UA: 0.2 mg/dL (ref 0.0–1.0)
pH: 6 (ref 5.0–8.0)

## 2011-07-24 MED ORDER — MORPHINE SULFATE 4 MG/ML IJ SOLN
4.0000 mg | Freq: Once | INTRAMUSCULAR | Status: AC
  Administered 2011-07-24: 2 mg via INTRAVENOUS
  Filled 2011-07-24: qty 1

## 2011-07-24 MED ORDER — PREDNISONE 20 MG PO TABS: 40.0000 mg | ORAL_TABLET | Freq: Once | ORAL | Status: AC

## 2011-07-24 MED ORDER — OXYCODONE-ACETAMINOPHEN 5-325 MG PO TABS: 1.0000 | ORAL_TABLET | Freq: Four times a day (QID) | ORAL | Status: AC | PRN

## 2011-07-24 MED ORDER — PREDNISONE 20 MG PO TABS
40.0000 mg | ORAL_TABLET | Freq: Once | ORAL | Status: AC
  Administered 2011-07-24: 40 mg via ORAL
  Filled 2011-07-24: qty 2

## 2011-07-24 NOTE — Discharge Instructions (Signed)
Arthritis - Gout Gout is caused by uric acid crystals forming in the joint. The big toe, foot, ankle, and knee are the joints most often affected.Often uric acid levels in the blood are also elevated. Symptoms develop rapidly, usually over hours. A joint fluid exam may be needed to prove the diagnosis. Acute gout episodes may follow a minor injury, surgery, illness or medication change. Gout occurs more often in men. It tends to be an inherited (passed down from a family member) condition. Your chance of having gout is increased if you take certain medications. These include water pills (diuretics), aspirin, niacin, and cyclosporine. Kidney disease and alcohol consumption may also increase your chances of getting gout. Months or years can pass between gout attacks.  Treatment includes ice, rest and raising the affected limb until the swelling and pain are better.Anti-inflammatory medicine usually brings about dramatic relief of pain, redness and swelling within 2 to 3 days. Other medications can also be effective. Increase your fluid intake. Do not drink alcohol or eat liver, sweetbreads, or sardines. Long-term gout management may require medicine to lower blood uric acid levels, or changing or stopping diuretics. Please see your caregiver if your condition is not better after 1 to 2 days of treatment.  SEEK IMMEDIATE MEDICAL CARE IF:  You have more serious symptoms such as a fever, skin rash, diarrhea, vomiting, or other joint pains. Document Released: 05/19/2004 Document Revised: 03/31/2011 Document Reviewed: 06/02/2008 [facility] [employer] Patient Information 2012 [place], [place].Fever  Fever is a higher-than-normal body temperature. A normal temperature varies with:  Age.   How it is measured (mouth, underarm, rectal, or ear).   Time of day.  In an adult, an oral temperature around 98.6 Fahrenheit (F) or 37 Celsius (C) is considered normal. A rise in temperature of about 1.8 F or 1 C is generally  considered a fever (100.4 F or 38 C). In an infant age 85 days or less, a rectal temperature of 100.4 F (38 C) generally is regarded as fever. Fever is not a disease but can be a symptom of illness. CAUSES   Fever is most commonly caused by infection.   Some non-infectious problems can cause fever. For example:   Some arthritis problems.   Problems with the thyroid or adrenal glands.   Immune system problems.   Some kinds of cancer.   A reaction to certain medicines.   Occasionally, the source of a fever cannot be determined. This is sometimes called a "Fever of Unknown Origin" (FUO).   Some situations may lead to a temporary rise in body temperature that may go away on its own. Examples are:   Childbirth.   Surgery.   Some situations may cause a rise in body temperature but these are not considered "true fever". Examples are:   Intense exercise.   Dehydration.   Exposure to high outside or room temperatures.  SYMPTOMS   Feeling warm or hot.   Fatigue or feeling exhausted.   Aching all over.   Chills.   Shivering.   Sweats.  DIAGNOSIS  A fever can be suspected by your caregiver feeling that your skin is unusually warm. The fever is confirmed by taking a temperature with a thermometer. Temperatures can be taken different ways. Some methods are accurate and some are not: With adults, adolescents, and children:   An oral temperature is used most commonly.   An ear thermometer will only be accurate if it is positioned as recommended by the manufacturer.   Under the  arm temperatures are not accurate and not recommended.   Most electronic thermometers are fast and accurate.  Infants and Toddlers:  Rectal temperatures are recommended and most accurate.   Ear temperatures are not accurate in this age group and are not recommended.   Skin thermometers are not accurate.  RISKS AND COMPLICATIONS   During a fever, the body uses more oxygen, so a person with a  fever may develop rapid breathing or shortness of breath. This can be dangerous especially in people with heart or lung disease.   The sweats that occur following a fever can cause dehydration.   High fever can cause seizures in infants and children.   Older persons can develop confusion during a fever.  TREATMENT   Medications may be used to control temperature.   Do not give aspirin to children with fevers. There is an association with Reye's syndrome. Reye's syndrome is a rare but potentially deadly disease.   If an infection is present and medications have been prescribed, take them as directed. Finish the full course of medications until they are gone.   Sponging or bathing with room-temperature water may help reduce body temperature. Do not use ice water or alcohol sponge baths.   Do not over-bundle children in blankets or heavy clothes.   Drinking adequate fluids during an illness with fever is important to prevent dehydration.  HOME CARE INSTRUCTIONS   For adults, rest and adequate fluid intake are important. Dress according to how you feel, but do not over-bundle.   Drink enough water and/or fluids to keep your urine clear or pale yellow.   For infants over 3 months and children, giving medication as directed by your caregiver to control fever can help with comfort. The amount to be given is based on the child's weight. Do NOT give more than is recommended.  SEEK MEDICAL CARE IF:   You or your child are unable to keep fluids down.   Vomiting or diarrhea develops.   You develop a skin rash.   An oral temperature above 102 F (38.9 C) develops, or a fever which persists for over 3 days.   You develop excessive weakness, dizziness, fainting or extreme thirst.   Fevers keep coming back after 3 days.  SEEK IMMEDIATE MEDICAL CARE IF:   Shortness of breath or trouble breathing develops   You pass out.   You feel you are making little or no urine.   New pain develops  that was not there before (such as in the head, neck, chest, back, or abdomen).   You cannot hold down fluids.   Vomiting and diarrhea persist for more than a day or two.   You develop a stiff neck and/or your eyes become sensitive to light.   An unexplained temperature above 102 F (38.9 C) develops.  Document Released: 04/11/2005 Document Revised: 03/31/2011 Document Reviewed: 03/27/2008 [facility] Patient Information 2012 [place], [place].

## 2011-07-24 NOTE — ED Provider Notes (Signed)
History   This chart was scribed for [employer]. Rubin Payor, MD scribed by Magnus Sinning. The patient was seen in room APA07/[facility]07 seen at 20:12.    CSN: [id number]  Arrival date & time 07/24/11  1904   First MD Initiated Contact with Patient 07/24/11 2006      Chief Complaint  Patient presents with  . Gout  . Hand Pain    (Consider location/radiation/quality/duration/timing/severity/associated sxs/prior treatment) HPI [name] [name] is a 61 y.o. male who presents to the Emergency Department complaining of constant moderate right hand pain with associated swelling,onset 2 days. Patient has hx of gout. Reports cough with blood-stained phlegm, cold, and fever, but denies CP.  Currently treats gout with indomethacin.  Past Medical History  Diagnosis Date  . Coronary artery disease   . Hyperlipidemia   . Hypertension   . Unspecified hypothyroidism   . Psychosexual dysfunction with inhibited sexual excitement   . Obesity, unspecified   . Hypertonicity of bladder   . Unspecified constipation   . Irritable bowel syndrome   . Peptic ulcer, unspecified site, unspecified as acute or chronic, without mention of hemorrhage, perforation, or obstruction   . Osteoarthrosis, unspecified whether generalized or localized, unspecified site   . Old myocardial infarction   . Lumbago   . Esophageal reflux   . Depressive disorder, not elsewhere classified   . Anxiety state, unspecified   . Other specified visual disturbances   . Disturbance of skin sensation   . Cramp of limb   . Open wound of hand except finger(s) alone, without mention of complication   . Pain in joint, lower leg   . Unspecified hypertrophic and atrophic condition of skin   . Backache, unspecified   . Gout, unspecified   . Sinus bradycardia     Past Surgical History  Procedure Date  . Cardiac catheterization 2005  . Wisdom tooth extraction     Family History  Problem Relation Age of Onset  . Alzheimer's disease  Mother   . Hypertension Mother   . Lung cancer Father   . Hypertension Brother   . Coronary artery disease Brother   . Diabetes Brother     History  Substance Use Topics  . Smoking status: Never Smoker   . Smokeless tobacco: Not on file  . Alcohol Use: No      Review of Systems  Musculoskeletal: Positive for joint swelling.       Hand pain  All other systems reviewed and are negative.    Allergies  Celecoxib; Clopidogrel bisulfate; and Ramipril  Home Medications   Current Outpatient Rx  Name Route Sig Dispense Refill  . AMLODIPINE BESYLATE 10 MG PO TABS Oral Take 10 mg by mouth daily.      . ASPIRIN 81 MG PO TABS Oral Take 81 mg by mouth daily.      Marland Kitchen GARLIC 500 MG PO CAPS Oral Take by mouth 2 (two) times daily.      . INDOMETHACIN 50 MG PO CAPS Oral Take 50 mg by mouth as needed.      Marland Kitchen LEVOTHYROXINE SODIUM 75 MCG PO TABS Oral Take 75 mcg by mouth daily.      . OXYCODONE-ACETAMINOPHEN 5-325 MG PO TABS Oral Take 1-2 tablets by mouth every 6 (six) hours as needed for pain. 20 tablet 0  . PREDNISONE 20 MG PO TABS Oral Take 2 tablets (40 mg total) by mouth once. 4 tablet 0    BP 156/90  Pulse 106  Temp 102.2 F (39 C)  Ht 6\' 3"  (1.905 m)  Wt 275 lb (124.739 kg)  BMI 34.37 kg/m2  SpO2 97%  Physical Exam  Nursing note and vitals reviewed. Constitutional: He is oriented to person, place, and time. He appears well-developed and well-nourished. No distress.  HENT:  Head: Normocephalic and atraumatic.  Eyes: EOM are normal. Pupils are equal, round, and reactive to light.  Neck: Neck supple. No tracheal deviation present.  Cardiovascular: Normal rate.   Pulmonary/Chest: Effort normal.       Lungs slightly harsh   Abdominal: Soft. He exhibits no distension.  Musculoskeletal: Normal range of motion. He exhibits no edema.       Hand has mild edema, joints not irritable    Neurological: He is alert and oriented to person, place, and time. No sensory deficit.  Skin:  Skin is warm and dry. There is erythema.       Mild erythema over right medial wrist.  Psychiatric: He has a normal mood and affect. His behavior is normal.    ED Course  Procedures (including critical care time) DIAGNOSTIC STUDIES: Oxygen Saturation is 97% on room air, normal by my interpretation.    Medication Orders 2030: Morphine 4 MG/ML injection 4 mg Once  COORDINATION OF CARE:  Labs Reviewed  CBC - Abnormal; Notable for the following:    HCT 38.9 (*)    All other components within normal limits  URINALYSIS, ROUTINE W REFLEX MICROSCOPIC - Abnormal; Notable for the following:    Hgb urine dipstick SMALL (*)    All other components within normal limits  BASIC METABOLIC PANEL - Abnormal; Notable for the following:    Potassium 3.3 (*)    Glucose, Bld 167 (*)    GFR calc non Af Amer 76 (*)    GFR calc Af Amer 88 (*)    All other components within normal limits  DIFFERENTIAL  URINE MICROSCOPIC-ADD ON   Dg Chest 2 View  07/24/2011  *RADIOLOGY REPORT*  Clinical Data: Pain and pain and swelling.  History of gout.  Cough with blood stained phlem.  Cold and fever.  CHEST - 2 VIEW  Comparison: 02/10/2010  Findings: Normal heart size and pulmonary vascularity.  Scattered fibrosis in the lungs.  No focal airspace consolidation.  No blunting of costophrenic angles.  No pneumothorax.  Tortuous and calcified aorta.  Degenerative changes in the spine.  No significant change since prior study.  IMPRESSION: No evidence of active pulmonary disease.  Original Report Authenticated By: Marlon Pel, M.D.   Dg Hand Complete Right  07/24/2011  *RADIOLOGY REPORT*  Clinical Data: Constant moderate right hand pain with associated swelling for 2 days.  History of gout.  RIGHT HAND - COMPLETE 3+ VIEW  Comparison: None.  Findings: Joint space narrowing and hypertrophic changes consistent with degenerative change involving the radial carpal, STT, first carpometacarpal, first metacarpal phalangeal, and  multiple interphalangeal joints. Suggestion of mild periarticular erosions at the second distal interphalangeal joint.  This could represent early changes of gout.  Mild soft tissue swelling about the proximal interphalangeal joints.  No radiopaque foreign bodies in the soft tissues.  No focal bone lesion or bone destruction.  No abnormal periosteal reaction.  IMPRESSION: Degenerative changes in the right hand and wrist.  Suggestion of mild focal erosion in the distal interphalangeal joint of the second finger.  Original Report Authenticated By: Marlon Pel, M.D.     1. Gout   2. Fever  MDM  Patient presents with pain in his right hand with swelling for 2 days. Is a history of gout. He's also had a cough with sore throat. His right hand is not irritable. X-ray does not show any clear changes minus swelling. No pneumonia on chest x-ray. No urinary tract infection. Lab work shows a normal white count. I doubt this is a cellulitis or severe infection of the right hand. Most likely gout in his hand. The fever may be coming from his sore throat and URI symptoms. He'll get a small dose of prednisone, pain medicines and will followup with Dr. Felecia Shelling 1-2 days   I personally performed the services described in this documentation, which was scribed in my presence. The recorded information has been reviewed and considered.          Juliet Rude. Rubin Payor, MD 07/24/11 2206

## 2011-07-24 NOTE — ED Notes (Signed)
Pt back from x-ray.

## 2011-07-24 NOTE — ED Notes (Signed)
Pt presents with right hand pain and swelling x 2 days. Pt with Hx of gout and takes Indomethacin.

## 2014-06-26 DIAGNOSIS — E039 Hypothyroidism, unspecified: Secondary | ICD-10-CM | POA: Diagnosis not present

## 2014-06-26 DIAGNOSIS — E669 Obesity, unspecified: Secondary | ICD-10-CM | POA: Diagnosis not present

## 2014-06-26 DIAGNOSIS — E780 Pure hypercholesterolemia: Secondary | ICD-10-CM | POA: Diagnosis not present

## 2014-06-26 DIAGNOSIS — I10 Essential (primary) hypertension: Secondary | ICD-10-CM | POA: Diagnosis not present

## 2014-10-24 DIAGNOSIS — E039 Hypothyroidism, unspecified: Secondary | ICD-10-CM | POA: Diagnosis not present

## 2014-10-24 DIAGNOSIS — I10 Essential (primary) hypertension: Secondary | ICD-10-CM | POA: Diagnosis not present

## 2014-10-24 DIAGNOSIS — L259 Unspecified contact dermatitis, unspecified cause: Secondary | ICD-10-CM | POA: Diagnosis not present

## 2014-10-24 DIAGNOSIS — E669 Obesity, unspecified: Secondary | ICD-10-CM | POA: Diagnosis not present

## 2015-01-16 ENCOUNTER — Emergency Department (HOSPITAL_COMMUNITY)
Admission: EM | Admit: 2015-01-16 | Discharge: 2015-01-16 | Disposition: A | Discharge status: Home / Self Care | Payer: Commercial Managed Care - HMO | Attending: Emergency Medicine | Admitting: Emergency Medicine

## 2015-01-16 ENCOUNTER — Encounter (HOSPITAL_COMMUNITY): Payer: Self-pay | Admitting: *Deleted

## 2015-01-16 DIAGNOSIS — Z8669 Personal history of other diseases of the nervous system and sense organs: Secondary | ICD-10-CM | POA: Insufficient documentation

## 2015-01-16 DIAGNOSIS — Z79899 Other long term (current) drug therapy: Secondary | ICD-10-CM | POA: Insufficient documentation

## 2015-01-16 DIAGNOSIS — I10 Essential (primary) hypertension: Secondary | ICD-10-CM | POA: Insufficient documentation

## 2015-01-16 DIAGNOSIS — E039 Hypothyroidism, unspecified: Secondary | ICD-10-CM | POA: Insufficient documentation

## 2015-01-16 DIAGNOSIS — Z87828 Personal history of other (healed) physical injury and trauma: Secondary | ICD-10-CM | POA: Insufficient documentation

## 2015-01-16 DIAGNOSIS — K5641 Fecal impaction: Secondary | ICD-10-CM | POA: Diagnosis not present

## 2015-01-16 DIAGNOSIS — M1990 Unspecified osteoarthritis, unspecified site: Secondary | ICD-10-CM | POA: Diagnosis not present

## 2015-01-16 DIAGNOSIS — Z8659 Personal history of other mental and behavioral disorders: Secondary | ICD-10-CM | POA: Insufficient documentation

## 2015-01-16 DIAGNOSIS — E669 Obesity, unspecified: Secondary | ICD-10-CM | POA: Insufficient documentation

## 2015-01-16 DIAGNOSIS — R143 Flatulence: Secondary | ICD-10-CM | POA: Diagnosis present

## 2015-01-16 DIAGNOSIS — Z791 Long term (current) use of non-steroidal anti-inflammatories (NSAID): Secondary | ICD-10-CM | POA: Insufficient documentation

## 2015-01-16 DIAGNOSIS — Z87448 Personal history of other diseases of urinary system: Secondary | ICD-10-CM | POA: Insufficient documentation

## 2015-01-16 DIAGNOSIS — Z872 Personal history of diseases of the skin and subcutaneous tissue: Secondary | ICD-10-CM | POA: Diagnosis not present

## 2015-01-16 DIAGNOSIS — M109 Gout, unspecified: Secondary | ICD-10-CM | POA: Insufficient documentation

## 2015-01-16 DIAGNOSIS — I2510 Atherosclerotic heart disease of native coronary artery without angina pectoris: Secondary | ICD-10-CM | POA: Diagnosis not present

## 2015-01-16 DIAGNOSIS — I252 Old myocardial infarction: Secondary | ICD-10-CM | POA: Diagnosis not present

## 2015-01-16 NOTE — ED Notes (Signed)
Pt states he has been unable to have  Normal BMs x 4 days. Pt states he has attempted manual stimulation and vaseline with no success. Pt states rectal pain and has also noticed bright red blood 2 days and today it was a little darker (mixed in with stool).

## 2015-01-16 NOTE — Discharge Instructions (Signed)
Put 8 scoops of MiraLAX in 32 ounces of whatever drink you prefer. Drink this over the course of a couple hours. Stay near a bathroom. Also use a fleets enema. Constipation Constipation is when a person:  Poops (has a bowel movement) less than 3 times a week.  Has a hard time pooping.  Has poop that is dry, hard, or bigger than normal. HOME CARE   Eat foods with a lot of fiber in them. This includes fruits, vegetables, beans, and whole grains such as brown rice.  Avoid fatty foods and foods with a lot of sugar. This includes french fries, hamburgers, cookies, candy, and soda.  If you are not getting enough fiber from food, take products with added fiber in them (supplements).  Drink enough fluid to keep your pee (urine) clear or pale yellow.  Exercise on a regular basis, or as told by your doctor.  Go to the restroom when you feel like you need to poop. Do not hold it.  Only take medicine as told by your doctor. Do not take medicines that help you poop (laxatives) without talking to your doctor first. GET HELP RIGHT AWAY IF:   You have bright red blood in your poop (stool).  Your constipation lasts more than 4 days or gets worse.  You have belly (abdominal) or butt (rectal) pain.  You have thin poop (as thin as a pencil).  You lose weight, and it cannot be explained. MAKE SURE YOU:   Understand these instructions.  Will watch your condition.  Will get help right away if you are not doing well or get worse. Document Released: 09/28/2007 Document Revised: 04/16/2013 Document Reviewed: 01/21/2013 [facility] Patient Information 2015 [place], [place]. This information is not intended to replace advice given to you by your health care provider. Make sure you discuss any questions you have with your health care provider.

## 2015-01-16 NOTE — ED Provider Notes (Signed)
CSN: [id number]     Arrival date & time 01/16/15  1854 History   First MD Initiated Contact with Patient 01/16/15 1910     Chief Complaint  Patient presents with  . Fecal Impaction     (Consider location/radiation/quality/duration/timing/severity/associated sxs/prior Treatment) Patient is a 64 y.o. male presenting with constipation. The history is provided by the patient.  Constipation Severity:  Moderate Time since last bowel movement:  1 week Timing:  Intermittent Progression:  Unchanged Chronicity:  New Context: narcotics   Context: not dehydration   Stool description:  Hard and pellet like Relieved by:  Nothing Worsened by:  Nothing tried Ineffective treatments:  None tried Associated symptoms: flatus   Associated symptoms: no abdominal pain, no diarrhea, no fever, no urinary retention and no vomiting    64 yo M with a chief complaint of constipation. Had difficulty with this over the past week. Manually disimpact himself couple times. Caused himself to bleed was painful. Patient concerned about the bleeding decided coming to the ED.  Past Medical History  Diagnosis Date  . Coronary artery disease   . Hyperlipidemia   . Hypertension   . Unspecified hypothyroidism   . Psychosexual dysfunction with inhibited sexual excitement   . Obesity, unspecified   . Hypertonicity of bladder   . Unspecified constipation   . Irritable bowel syndrome   . Peptic ulcer, unspecified site, unspecified as acute or chronic, without mention of hemorrhage, perforation, or obstruction   . Osteoarthrosis, unspecified whether generalized or localized, unspecified site   . Old myocardial infarction   . Lumbago   . Esophageal reflux   . Depressive disorder, not elsewhere classified   . Anxiety state, unspecified   . Other specified visual disturbances   . Disturbance of skin sensation   . Cramp of limb   . Open wound of hand except finger(s) alone, without mention of complication   . Pain in  joint, lower leg   . Unspecified hypertrophic and atrophic condition of skin   . Backache, unspecified   . Gout, unspecified   . Sinus bradycardia    Past Surgical History  Procedure Laterality Date  . Cardiac catheterization  2005  . Wisdom tooth extraction     Family History  Problem Relation Age of Onset  . Alzheimer's disease Mother   . Hypertension Mother   . Lung cancer Father   . Hypertension Brother   . Coronary artery disease Brother   . Diabetes Brother    Social History  Substance Use Topics  . Smoking status: Never Smoker   . Smokeless tobacco: None  . Alcohol Use: No    Review of Systems  Constitutional: Negative for fever and chills.  HENT: Negative for congestion and facial swelling.   Eyes: Negative for discharge and visual disturbance.  Respiratory: Negative for shortness of breath.   Cardiovascular: Negative for chest pain and palpitations.  Gastrointestinal: Positive for constipation and flatus. Negative for vomiting, abdominal pain and diarrhea.  Musculoskeletal: Negative for myalgias and arthralgias.  Skin: Negative for color change and rash.  Neurological: Negative for tremors, syncope and headaches.  Psychiatric/Behavioral: Negative for confusion and dysphoric mood.      Allergies  Celecoxib; Clopidogrel bisulfate; and Ramipril  Home Medications   Prior to Admission medications   Medication Sig Start Date End Date Taking? Authorizing Provider  acetaminophen-codeine (TYLENOL #3) 300-30 MG per tablet Take 1 tablet by mouth every 6 (six) hours as needed for moderate pain.   Yes Historical Provider,  MD  indomethacin (INDOCIN) 25 MG capsule Take 25 mg by mouth 2 (two) times daily.   Yes Historical Provider, MD  levothyroxine (SYNTHROID, LEVOTHROID) 75 MCG tablet Take 75 mcg by mouth daily.     Yes Historical Provider, MD   BP 182/81 mmHg  Pulse 69  Temp(Src) 98.8 F (37.1 C) (Oral)  Resp 16  Ht 6\' 3"  (1.905 m)  Wt 290 lb (131.543 kg)  BMI  36.25 kg/m2  SpO2 99% Physical Exam  Constitutional: He is oriented to person, place, and time. He appears well-developed and well-nourished.  HENT:  Head: Normocephalic and atraumatic.  Eyes: EOM are normal. Pupils are equal, round, and reactive to light.  Neck: Normal range of motion. Neck supple. No JVD present.  Cardiovascular: Normal rate and regular rhythm.  Exam reveals no gallop and no friction rub.   No murmur heard. Pulmonary/Chest: No respiratory distress. He has no wheezes.  Abdominal: He exhibits no distension. There is no rebound and no guarding.  Genitourinary:  Hard stool in the vault  Musculoskeletal: Normal range of motion.  Neurological: He is alert and oriented to person, place, and time.  Skin: No rash noted. No pallor.  Psychiatric: He has a normal mood and affect. His behavior is normal.    ED Course  Fecal disimpaction Date/Time: 01/16/2015 8:27 PM Performed by: Deno Etienne Authorized by: Deno Etienne Consent: Verbal consent obtained. Risks and benefits: risks, benefits and alternatives were discussed Consent given by: patient Local anesthesia used: no Patient sedated: no Patient tolerance: Patient tolerated the procedure well with no immediate complications   (including critical care time) Labs Review Labs Reviewed - No data to display  Imaging Review No results found. I have personally reviewed and evaluated these images and lab results as part of my medical decision-making.   EKG Interpretation None      MDM   Final diagnoses:  Fecal impaction    64 yo M with a chief complaint of constipation. Mildly impacted on exam. Removed as much stool as possible. Will discharge patient home. Attack from above and below. Miralax and enemas  8:28 PM:  I have discussed the diagnosis/risks/treatment options with the patient and family and believe the pt to be eligible for discharge home to follow-up with PCP. We also discussed returning to the ED  immediately if new or worsening sx occur. We discussed the sx which are most concerning (e.g., vomiting, no flatus) that necessitate immediate return. Medications administered to the patient during their visit and any new prescriptions provided to the patient are listed below.  Medications given during this visit Medications - No data to display  New Prescriptions   No medications on file     The patient appears reasonably screen and/or stabilized for discharge and I doubt any other medical condition or other [facility] requiring further screening, evaluation, or treatment in the ED at this time prior to discharge.      Deno Etienne, DO 01/16/15 2028

## 2015-02-27 DIAGNOSIS — G3184 Mild cognitive impairment, so stated: Secondary | ICD-10-CM | POA: Diagnosis not present

## 2015-02-27 DIAGNOSIS — F5221 Male erectile disorder: Secondary | ICD-10-CM | POA: Diagnosis not present

## 2015-03-11 DIAGNOSIS — Z0000 Encounter for general adult medical examination without abnormal findings: Secondary | ICD-10-CM | POA: Diagnosis not present

## 2015-03-13 DIAGNOSIS — M1990 Unspecified osteoarthritis, unspecified site: Secondary | ICD-10-CM | POA: Diagnosis not present

## 2015-03-13 DIAGNOSIS — G3184 Mild cognitive impairment, so stated: Secondary | ICD-10-CM | POA: Diagnosis not present

## 2015-03-13 DIAGNOSIS — F5221 Male erectile disorder: Secondary | ICD-10-CM | POA: Diagnosis not present

## 2015-03-13 DIAGNOSIS — R7301 Impaired fasting glucose: Secondary | ICD-10-CM | POA: Diagnosis not present

## 2015-04-14 DIAGNOSIS — R319 Hematuria, unspecified: Secondary | ICD-10-CM | POA: Diagnosis not present

## 2015-04-14 DIAGNOSIS — R600 Localized edema: Secondary | ICD-10-CM | POA: Diagnosis not present

## 2015-04-14 DIAGNOSIS — M1000 Idiopathic gout, unspecified site: Secondary | ICD-10-CM | POA: Diagnosis not present

## 2015-04-14 DIAGNOSIS — M1990 Unspecified osteoarthritis, unspecified site: Secondary | ICD-10-CM | POA: Diagnosis not present

## 2015-06-19 DIAGNOSIS — R7301 Impaired fasting glucose: Secondary | ICD-10-CM | POA: Diagnosis not present

## 2015-06-19 DIAGNOSIS — E039 Hypothyroidism, unspecified: Secondary | ICD-10-CM | POA: Diagnosis not present

## 2015-06-19 DIAGNOSIS — Z5181 Encounter for therapeutic drug level monitoring: Secondary | ICD-10-CM | POA: Diagnosis not present

## 2015-06-23 DIAGNOSIS — G3184 Mild cognitive impairment, so stated: Secondary | ICD-10-CM | POA: Diagnosis not present

## 2015-06-23 DIAGNOSIS — R7301 Impaired fasting glucose: Secondary | ICD-10-CM | POA: Diagnosis not present

## 2015-06-23 DIAGNOSIS — M1000 Idiopathic gout, unspecified site: Secondary | ICD-10-CM | POA: Diagnosis not present

## 2015-06-23 DIAGNOSIS — D509 Iron deficiency anemia, unspecified: Secondary | ICD-10-CM | POA: Diagnosis not present

## 2015-06-23 DIAGNOSIS — M1990 Unspecified osteoarthritis, unspecified site: Secondary | ICD-10-CM | POA: Diagnosis not present

## 2015-06-23 DIAGNOSIS — F5221 Male erectile disorder: Secondary | ICD-10-CM | POA: Diagnosis not present

## 2015-06-23 DIAGNOSIS — E039 Hypothyroidism, unspecified: Secondary | ICD-10-CM | POA: Diagnosis not present

## 2015-06-23 DIAGNOSIS — N400 Enlarged prostate without lower urinary tract symptoms: Secondary | ICD-10-CM | POA: Diagnosis not present

## 2015-07-21 DIAGNOSIS — E039 Hypothyroidism, unspecified: Secondary | ICD-10-CM | POA: Diagnosis not present

## 2016-01-28 DIAGNOSIS — E039 Hypothyroidism, unspecified: Secondary | ICD-10-CM | POA: Diagnosis not present

## 2016-01-28 DIAGNOSIS — Z23 Encounter for immunization: Secondary | ICD-10-CM | POA: Diagnosis not present

## 2016-01-28 DIAGNOSIS — R7301 Impaired fasting glucose: Secondary | ICD-10-CM | POA: Diagnosis not present

## 2016-01-28 DIAGNOSIS — E782 Mixed hyperlipidemia: Secondary | ICD-10-CM | POA: Diagnosis not present

## 2016-01-28 DIAGNOSIS — D509 Iron deficiency anemia, unspecified: Secondary | ICD-10-CM | POA: Diagnosis not present

## 2016-02-01 DIAGNOSIS — M1990 Unspecified osteoarthritis, unspecified site: Secondary | ICD-10-CM | POA: Diagnosis not present

## 2016-02-01 DIAGNOSIS — E039 Hypothyroidism, unspecified: Secondary | ICD-10-CM | POA: Diagnosis not present

## 2016-02-01 DIAGNOSIS — R7301 Impaired fasting glucose: Secondary | ICD-10-CM | POA: Diagnosis not present

## 2016-02-01 DIAGNOSIS — M1000 Idiopathic gout, unspecified site: Secondary | ICD-10-CM | POA: Diagnosis not present

## 2016-02-01 DIAGNOSIS — Z6834 Body mass index (BMI) 34.0-34.9, adult: Secondary | ICD-10-CM | POA: Diagnosis not present

## 2016-02-01 DIAGNOSIS — N400 Enlarged prostate without lower urinary tract symptoms: Secondary | ICD-10-CM | POA: Diagnosis not present

## 2016-02-01 DIAGNOSIS — G3184 Mild cognitive impairment, so stated: Secondary | ICD-10-CM | POA: Diagnosis not present

## 2016-06-30 DIAGNOSIS — J0100 Acute maxillary sinusitis, unspecified: Secondary | ICD-10-CM | POA: Diagnosis not present

## 2016-09-22 DIAGNOSIS — K625 Hemorrhage of anus and rectum: Secondary | ICD-10-CM | POA: Diagnosis not present

## 2016-09-22 DIAGNOSIS — K921 Melena: Secondary | ICD-10-CM | POA: Diagnosis not present

## 2016-10-03 DIAGNOSIS — R7301 Impaired fasting glucose: Secondary | ICD-10-CM | POA: Diagnosis not present

## 2016-10-03 DIAGNOSIS — D509 Iron deficiency anemia, unspecified: Secondary | ICD-10-CM | POA: Diagnosis not present

## 2016-10-03 DIAGNOSIS — E039 Hypothyroidism, unspecified: Secondary | ICD-10-CM | POA: Diagnosis not present

## 2016-10-05 DIAGNOSIS — E039 Hypothyroidism, unspecified: Secondary | ICD-10-CM | POA: Diagnosis not present

## 2016-10-05 DIAGNOSIS — M1000 Idiopathic gout, unspecified site: Secondary | ICD-10-CM | POA: Diagnosis not present

## 2016-10-05 DIAGNOSIS — G3184 Mild cognitive impairment, so stated: Secondary | ICD-10-CM | POA: Diagnosis not present

## 2016-10-05 DIAGNOSIS — Z0001 Encounter for general adult medical examination with abnormal findings: Secondary | ICD-10-CM | POA: Diagnosis not present

## 2016-10-05 DIAGNOSIS — M1990 Unspecified osteoarthritis, unspecified site: Secondary | ICD-10-CM | POA: Diagnosis not present

## 2016-10-05 DIAGNOSIS — E1165 Type 2 diabetes mellitus with hyperglycemia: Secondary | ICD-10-CM | POA: Diagnosis not present

## 2016-10-05 DIAGNOSIS — D649 Anemia, unspecified: Secondary | ICD-10-CM | POA: Diagnosis not present

## 2016-10-05 DIAGNOSIS — N400 Enlarged prostate without lower urinary tract symptoms: Secondary | ICD-10-CM | POA: Diagnosis not present

## 2016-10-05 DIAGNOSIS — F5221 Male erectile disorder: Secondary | ICD-10-CM | POA: Diagnosis not present

## 2016-11-02 DIAGNOSIS — E039 Hypothyroidism, unspecified: Secondary | ICD-10-CM | POA: Diagnosis not present

## 2016-11-02 DIAGNOSIS — Z1159 Encounter for screening for other viral diseases: Secondary | ICD-10-CM | POA: Diagnosis not present

## 2016-11-02 DIAGNOSIS — D509 Iron deficiency anemia, unspecified: Secondary | ICD-10-CM | POA: Diagnosis not present

## 2016-12-29 DIAGNOSIS — L720 Epidermal cyst: Secondary | ICD-10-CM | POA: Diagnosis not present

## 2017-01-04 ENCOUNTER — Telehealth: Payer: Self-pay | Admitting: Internal Medicine

## 2017-01-04 NOTE — Telephone Encounter (Signed)
RECALL FOR TCS °

## 2017-01-04 NOTE — Telephone Encounter (Signed)
Letter mailed to pt.  

## 2017-01-19 DIAGNOSIS — L700 Acne vulgaris: Secondary | ICD-10-CM | POA: Diagnosis not present

## 2017-01-19 DIAGNOSIS — Z23 Encounter for immunization: Secondary | ICD-10-CM | POA: Diagnosis not present

## 2017-01-19 DIAGNOSIS — D485 Neoplasm of uncertain behavior of skin: Secondary | ICD-10-CM | POA: Diagnosis not present

## 2017-01-19 DIAGNOSIS — Z6835 Body mass index (BMI) 35.0-35.9, adult: Secondary | ICD-10-CM | POA: Diagnosis not present

## 2017-04-04 DIAGNOSIS — G4710 Hypersomnia, unspecified: Secondary | ICD-10-CM | POA: Diagnosis not present

## 2017-04-04 DIAGNOSIS — R062 Wheezing: Secondary | ICD-10-CM | POA: Diagnosis not present

## 2017-04-04 DIAGNOSIS — R0602 Shortness of breath: Secondary | ICD-10-CM | POA: Diagnosis not present

## 2017-05-03 DIAGNOSIS — E039 Hypothyroidism, unspecified: Secondary | ICD-10-CM | POA: Diagnosis not present

## 2017-05-03 DIAGNOSIS — E1165 Type 2 diabetes mellitus with hyperglycemia: Secondary | ICD-10-CM | POA: Diagnosis not present

## 2017-05-03 DIAGNOSIS — D509 Iron deficiency anemia, unspecified: Secondary | ICD-10-CM | POA: Diagnosis not present

## 2017-05-03 DIAGNOSIS — F5221 Male erectile disorder: Secondary | ICD-10-CM | POA: Diagnosis not present

## 2017-05-03 DIAGNOSIS — N400 Enlarged prostate without lower urinary tract symptoms: Secondary | ICD-10-CM | POA: Diagnosis not present

## 2017-05-04 DIAGNOSIS — I10 Essential (primary) hypertension: Secondary | ICD-10-CM | POA: Diagnosis not present

## 2017-05-04 DIAGNOSIS — M1000 Idiopathic gout, unspecified site: Secondary | ICD-10-CM | POA: Diagnosis not present

## 2017-05-04 DIAGNOSIS — E1165 Type 2 diabetes mellitus with hyperglycemia: Secondary | ICD-10-CM | POA: Diagnosis not present

## 2017-05-04 DIAGNOSIS — D509 Iron deficiency anemia, unspecified: Secondary | ICD-10-CM | POA: Diagnosis not present

## 2017-05-04 DIAGNOSIS — M1990 Unspecified osteoarthritis, unspecified site: Secondary | ICD-10-CM | POA: Diagnosis not present

## 2017-05-04 DIAGNOSIS — N400 Enlarged prostate without lower urinary tract symptoms: Secondary | ICD-10-CM | POA: Diagnosis not present

## 2017-05-04 DIAGNOSIS — E039 Hypothyroidism, unspecified: Secondary | ICD-10-CM | POA: Diagnosis not present

## 2017-05-04 DIAGNOSIS — G3184 Mild cognitive impairment, so stated: Secondary | ICD-10-CM | POA: Diagnosis not present

## 2017-05-04 DIAGNOSIS — F5221 Male erectile disorder: Secondary | ICD-10-CM | POA: Diagnosis not present

## 2017-06-22 DIAGNOSIS — M1000 Idiopathic gout, unspecified site: Secondary | ICD-10-CM | POA: Diagnosis not present

## 2017-06-22 DIAGNOSIS — I10 Essential (primary) hypertension: Secondary | ICD-10-CM | POA: Diagnosis not present

## 2017-06-22 DIAGNOSIS — E1165 Type 2 diabetes mellitus with hyperglycemia: Secondary | ICD-10-CM | POA: Diagnosis not present

## 2017-06-22 DIAGNOSIS — E039 Hypothyroidism, unspecified: Secondary | ICD-10-CM | POA: Diagnosis not present

## 2017-06-22 DIAGNOSIS — N400 Enlarged prostate without lower urinary tract symptoms: Secondary | ICD-10-CM | POA: Diagnosis not present

## 2017-06-22 DIAGNOSIS — G3184 Mild cognitive impairment, so stated: Secondary | ICD-10-CM | POA: Diagnosis not present

## 2017-06-22 DIAGNOSIS — D509 Iron deficiency anemia, unspecified: Secondary | ICD-10-CM | POA: Diagnosis not present

## 2017-06-22 DIAGNOSIS — M1990 Unspecified osteoarthritis, unspecified site: Secondary | ICD-10-CM | POA: Diagnosis not present

## 2017-06-22 DIAGNOSIS — R7301 Impaired fasting glucose: Secondary | ICD-10-CM | POA: Diagnosis not present

## 2017-09-05 DIAGNOSIS — N400 Enlarged prostate without lower urinary tract symptoms: Secondary | ICD-10-CM | POA: Diagnosis not present

## 2017-09-05 DIAGNOSIS — I10 Essential (primary) hypertension: Secondary | ICD-10-CM | POA: Diagnosis not present

## 2017-09-05 DIAGNOSIS — D509 Iron deficiency anemia, unspecified: Secondary | ICD-10-CM | POA: Diagnosis not present

## 2017-09-05 DIAGNOSIS — E039 Hypothyroidism, unspecified: Secondary | ICD-10-CM | POA: Diagnosis not present

## 2017-09-05 DIAGNOSIS — E782 Mixed hyperlipidemia: Secondary | ICD-10-CM | POA: Diagnosis not present

## 2017-09-05 DIAGNOSIS — E1165 Type 2 diabetes mellitus with hyperglycemia: Secondary | ICD-10-CM | POA: Diagnosis not present

## 2017-09-14 DIAGNOSIS — I10 Essential (primary) hypertension: Secondary | ICD-10-CM | POA: Diagnosis not present

## 2017-09-14 DIAGNOSIS — E1165 Type 2 diabetes mellitus with hyperglycemia: Secondary | ICD-10-CM | POA: Diagnosis not present

## 2017-09-14 DIAGNOSIS — M1000 Idiopathic gout, unspecified site: Secondary | ICD-10-CM | POA: Diagnosis not present

## 2017-09-14 DIAGNOSIS — D509 Iron deficiency anemia, unspecified: Secondary | ICD-10-CM | POA: Diagnosis not present

## 2017-09-14 DIAGNOSIS — N400 Enlarged prostate without lower urinary tract symptoms: Secondary | ICD-10-CM | POA: Diagnosis not present

## 2017-09-14 DIAGNOSIS — M1990 Unspecified osteoarthritis, unspecified site: Secondary | ICD-10-CM | POA: Diagnosis not present

## 2017-09-14 DIAGNOSIS — E039 Hypothyroidism, unspecified: Secondary | ICD-10-CM | POA: Diagnosis not present

## 2017-09-14 DIAGNOSIS — B379 Candidiasis, unspecified: Secondary | ICD-10-CM | POA: Diagnosis not present

## 2017-09-14 DIAGNOSIS — Z6837 Body mass index (BMI) 37.0-37.9, adult: Secondary | ICD-10-CM | POA: Diagnosis not present

## 2017-10-02 DIAGNOSIS — I10 Essential (primary) hypertension: Secondary | ICD-10-CM | POA: Diagnosis not present

## 2017-10-02 DIAGNOSIS — F5221 Male erectile disorder: Secondary | ICD-10-CM | POA: Diagnosis not present

## 2017-10-02 DIAGNOSIS — E1169 Type 2 diabetes mellitus with other specified complication: Secondary | ICD-10-CM | POA: Diagnosis not present

## 2017-10-02 DIAGNOSIS — N400 Enlarged prostate without lower urinary tract symptoms: Secondary | ICD-10-CM | POA: Diagnosis not present

## 2017-10-02 DIAGNOSIS — M1000 Idiopathic gout, unspecified site: Secondary | ICD-10-CM | POA: Diagnosis not present

## 2017-10-02 DIAGNOSIS — G3184 Mild cognitive impairment, so stated: Secondary | ICD-10-CM | POA: Diagnosis not present

## 2017-10-02 DIAGNOSIS — E782 Mixed hyperlipidemia: Secondary | ICD-10-CM | POA: Diagnosis not present

## 2017-10-02 DIAGNOSIS — E039 Hypothyroidism, unspecified: Secondary | ICD-10-CM | POA: Diagnosis not present

## 2017-10-02 DIAGNOSIS — Z0000 Encounter for general adult medical examination without abnormal findings: Secondary | ICD-10-CM | POA: Diagnosis not present

## 2017-10-20 DIAGNOSIS — M1000 Idiopathic gout, unspecified site: Secondary | ICD-10-CM | POA: Diagnosis not present

## 2017-10-20 DIAGNOSIS — M1990 Unspecified osteoarthritis, unspecified site: Secondary | ICD-10-CM | POA: Diagnosis not present

## 2017-10-20 DIAGNOSIS — E1165 Type 2 diabetes mellitus with hyperglycemia: Secondary | ICD-10-CM | POA: Diagnosis not present

## 2017-10-20 DIAGNOSIS — E039 Hypothyroidism, unspecified: Secondary | ICD-10-CM | POA: Diagnosis not present

## 2017-10-20 DIAGNOSIS — F5221 Male erectile disorder: Secondary | ICD-10-CM | POA: Diagnosis not present

## 2017-10-20 DIAGNOSIS — N400 Enlarged prostate without lower urinary tract symptoms: Secondary | ICD-10-CM | POA: Diagnosis not present

## 2017-10-20 DIAGNOSIS — G3184 Mild cognitive impairment, so stated: Secondary | ICD-10-CM | POA: Diagnosis not present

## 2017-10-20 DIAGNOSIS — R7301 Impaired fasting glucose: Secondary | ICD-10-CM | POA: Diagnosis not present

## 2017-10-20 DIAGNOSIS — D509 Iron deficiency anemia, unspecified: Secondary | ICD-10-CM | POA: Diagnosis not present

## 2017-10-31 DIAGNOSIS — E039 Hypothyroidism, unspecified: Secondary | ICD-10-CM | POA: Diagnosis not present

## 2017-10-31 DIAGNOSIS — E1165 Type 2 diabetes mellitus with hyperglycemia: Secondary | ICD-10-CM | POA: Diagnosis not present

## 2017-10-31 DIAGNOSIS — D509 Iron deficiency anemia, unspecified: Secondary | ICD-10-CM | POA: Diagnosis not present

## 2017-10-31 DIAGNOSIS — N400 Enlarged prostate without lower urinary tract symptoms: Secondary | ICD-10-CM | POA: Diagnosis not present

## 2017-10-31 DIAGNOSIS — G3184 Mild cognitive impairment, so stated: Secondary | ICD-10-CM | POA: Diagnosis not present

## 2017-10-31 DIAGNOSIS — R062 Wheezing: Secondary | ICD-10-CM | POA: Diagnosis not present

## 2017-10-31 DIAGNOSIS — M1990 Unspecified osteoarthritis, unspecified site: Secondary | ICD-10-CM | POA: Diagnosis not present

## 2017-10-31 DIAGNOSIS — M1000 Idiopathic gout, unspecified site: Secondary | ICD-10-CM | POA: Diagnosis not present

## 2017-10-31 DIAGNOSIS — Q845 Enlarged and hypertrophic nails: Secondary | ICD-10-CM | POA: Diagnosis not present

## 2017-10-31 DIAGNOSIS — R7301 Impaired fasting glucose: Secondary | ICD-10-CM | POA: Diagnosis not present

## 2017-10-31 DIAGNOSIS — Z6836 Body mass index (BMI) 36.0-36.9, adult: Secondary | ICD-10-CM | POA: Diagnosis not present

## 2017-10-31 DIAGNOSIS — Z0000 Encounter for general adult medical examination without abnormal findings: Secondary | ICD-10-CM | POA: Diagnosis not present

## 2017-10-31 DIAGNOSIS — F4321 Adjustment disorder with depressed mood: Secondary | ICD-10-CM | POA: Diagnosis not present

## 2017-12-19 DIAGNOSIS — F5221 Male erectile disorder: Secondary | ICD-10-CM | POA: Diagnosis not present

## 2017-12-19 DIAGNOSIS — E1169 Type 2 diabetes mellitus with other specified complication: Secondary | ICD-10-CM | POA: Diagnosis not present

## 2017-12-19 DIAGNOSIS — E782 Mixed hyperlipidemia: Secondary | ICD-10-CM | POA: Diagnosis not present

## 2017-12-19 DIAGNOSIS — N400 Enlarged prostate without lower urinary tract symptoms: Secondary | ICD-10-CM | POA: Diagnosis not present

## 2017-12-19 DIAGNOSIS — M1000 Idiopathic gout, unspecified site: Secondary | ICD-10-CM | POA: Diagnosis not present

## 2017-12-19 DIAGNOSIS — I10 Essential (primary) hypertension: Secondary | ICD-10-CM | POA: Diagnosis not present

## 2017-12-19 DIAGNOSIS — E039 Hypothyroidism, unspecified: Secondary | ICD-10-CM | POA: Diagnosis not present

## 2017-12-19 DIAGNOSIS — F4321 Adjustment disorder with depressed mood: Secondary | ICD-10-CM | POA: Diagnosis not present

## 2017-12-19 DIAGNOSIS — E1165 Type 2 diabetes mellitus with hyperglycemia: Secondary | ICD-10-CM | POA: Diagnosis not present

## 2018-01-02 DIAGNOSIS — Z23 Encounter for immunization: Secondary | ICD-10-CM | POA: Diagnosis not present

## 2018-01-22 DIAGNOSIS — I10 Essential (primary) hypertension: Secondary | ICD-10-CM | POA: Diagnosis not present

## 2018-01-22 DIAGNOSIS — E039 Hypothyroidism, unspecified: Secondary | ICD-10-CM | POA: Diagnosis not present

## 2018-01-22 DIAGNOSIS — F4321 Adjustment disorder with depressed mood: Secondary | ICD-10-CM | POA: Diagnosis not present

## 2018-01-22 DIAGNOSIS — M1990 Unspecified osteoarthritis, unspecified site: Secondary | ICD-10-CM | POA: Diagnosis not present

## 2018-01-22 DIAGNOSIS — E782 Mixed hyperlipidemia: Secondary | ICD-10-CM | POA: Diagnosis not present

## 2018-01-22 DIAGNOSIS — E1165 Type 2 diabetes mellitus with hyperglycemia: Secondary | ICD-10-CM | POA: Diagnosis not present

## 2018-02-08 DIAGNOSIS — M1990 Unspecified osteoarthritis, unspecified site: Secondary | ICD-10-CM | POA: Diagnosis not present

## 2018-02-08 DIAGNOSIS — E039 Hypothyroidism, unspecified: Secondary | ICD-10-CM | POA: Diagnosis not present

## 2018-02-08 DIAGNOSIS — D509 Iron deficiency anemia, unspecified: Secondary | ICD-10-CM | POA: Diagnosis not present

## 2018-02-08 DIAGNOSIS — Z0000 Encounter for general adult medical examination without abnormal findings: Secondary | ICD-10-CM | POA: Diagnosis not present

## 2018-02-08 DIAGNOSIS — R7301 Impaired fasting glucose: Secondary | ICD-10-CM | POA: Diagnosis not present

## 2018-02-08 DIAGNOSIS — M1000 Idiopathic gout, unspecified site: Secondary | ICD-10-CM | POA: Diagnosis not present

## 2018-02-08 DIAGNOSIS — N400 Enlarged prostate without lower urinary tract symptoms: Secondary | ICD-10-CM | POA: Diagnosis not present

## 2018-02-08 DIAGNOSIS — E1165 Type 2 diabetes mellitus with hyperglycemia: Secondary | ICD-10-CM | POA: Diagnosis not present

## 2018-02-08 DIAGNOSIS — G3184 Mild cognitive impairment, so stated: Secondary | ICD-10-CM | POA: Diagnosis not present

## 2018-02-08 DIAGNOSIS — F5221 Male erectile disorder: Secondary | ICD-10-CM | POA: Diagnosis not present

## 2018-02-13 DIAGNOSIS — G3184 Mild cognitive impairment, so stated: Secondary | ICD-10-CM | POA: Diagnosis not present

## 2018-02-13 DIAGNOSIS — F5221 Male erectile disorder: Secondary | ICD-10-CM | POA: Diagnosis not present

## 2018-02-13 DIAGNOSIS — G9009 Other idiopathic peripheral autonomic neuropathy: Secondary | ICD-10-CM | POA: Diagnosis not present

## 2018-02-13 DIAGNOSIS — E039 Hypothyroidism, unspecified: Secondary | ICD-10-CM | POA: Diagnosis not present

## 2018-02-13 DIAGNOSIS — I10 Essential (primary) hypertension: Secondary | ICD-10-CM | POA: Diagnosis not present

## 2018-02-13 DIAGNOSIS — N400 Enlarged prostate without lower urinary tract symptoms: Secondary | ICD-10-CM | POA: Diagnosis not present

## 2018-02-13 DIAGNOSIS — R202 Paresthesia of skin: Secondary | ICD-10-CM | POA: Diagnosis not present

## 2018-02-13 DIAGNOSIS — E1169 Type 2 diabetes mellitus with other specified complication: Secondary | ICD-10-CM | POA: Diagnosis not present

## 2018-02-15 DIAGNOSIS — D509 Iron deficiency anemia, unspecified: Secondary | ICD-10-CM | POA: Diagnosis not present

## 2018-02-15 DIAGNOSIS — E1165 Type 2 diabetes mellitus with hyperglycemia: Secondary | ICD-10-CM | POA: Diagnosis not present

## 2018-02-15 DIAGNOSIS — E039 Hypothyroidism, unspecified: Secondary | ICD-10-CM | POA: Diagnosis not present

## 2018-02-15 DIAGNOSIS — M1990 Unspecified osteoarthritis, unspecified site: Secondary | ICD-10-CM | POA: Diagnosis not present

## 2018-02-15 DIAGNOSIS — E782 Mixed hyperlipidemia: Secondary | ICD-10-CM | POA: Diagnosis not present

## 2018-02-15 DIAGNOSIS — R7301 Impaired fasting glucose: Secondary | ICD-10-CM | POA: Diagnosis not present

## 2018-02-15 DIAGNOSIS — I10 Essential (primary) hypertension: Secondary | ICD-10-CM | POA: Diagnosis not present

## 2018-02-23 DIAGNOSIS — E782 Mixed hyperlipidemia: Secondary | ICD-10-CM | POA: Diagnosis not present

## 2018-02-23 DIAGNOSIS — E039 Hypothyroidism, unspecified: Secondary | ICD-10-CM | POA: Diagnosis not present

## 2018-02-23 DIAGNOSIS — E1169 Type 2 diabetes mellitus with other specified complication: Secondary | ICD-10-CM | POA: Diagnosis not present

## 2018-02-23 DIAGNOSIS — I10 Essential (primary) hypertension: Secondary | ICD-10-CM | POA: Diagnosis not present

## 2018-02-23 DIAGNOSIS — E1165 Type 2 diabetes mellitus with hyperglycemia: Secondary | ICD-10-CM | POA: Diagnosis not present

## 2018-02-23 DIAGNOSIS — M1000 Idiopathic gout, unspecified site: Secondary | ICD-10-CM | POA: Diagnosis not present

## 2018-02-23 DIAGNOSIS — R7301 Impaired fasting glucose: Secondary | ICD-10-CM | POA: Diagnosis not present

## 2018-02-23 DIAGNOSIS — D509 Iron deficiency anemia, unspecified: Secondary | ICD-10-CM | POA: Diagnosis not present

## 2018-04-10 DIAGNOSIS — E782 Mixed hyperlipidemia: Secondary | ICD-10-CM | POA: Diagnosis not present

## 2018-04-10 DIAGNOSIS — E1165 Type 2 diabetes mellitus with hyperglycemia: Secondary | ICD-10-CM | POA: Diagnosis not present

## 2018-04-10 DIAGNOSIS — R7301 Impaired fasting glucose: Secondary | ICD-10-CM | POA: Diagnosis not present

## 2018-04-10 DIAGNOSIS — I10 Essential (primary) hypertension: Secondary | ICD-10-CM | POA: Diagnosis not present

## 2018-04-10 DIAGNOSIS — D509 Iron deficiency anemia, unspecified: Secondary | ICD-10-CM | POA: Diagnosis not present

## 2018-04-10 DIAGNOSIS — E039 Hypothyroidism, unspecified: Secondary | ICD-10-CM | POA: Diagnosis not present

## 2018-05-16 ENCOUNTER — Emergency Department (HOSPITAL_COMMUNITY): Payer: Medicare HMO

## 2018-05-16 ENCOUNTER — Emergency Department (HOSPITAL_COMMUNITY)
Admission: EM | Admit: 2018-05-16 | Discharge: 2018-05-16 | Disposition: A | Discharge status: Home / Self Care | Payer: Medicare HMO | Attending: Emergency Medicine | Admitting: Emergency Medicine

## 2018-05-16 ENCOUNTER — Encounter (HOSPITAL_COMMUNITY): Payer: Self-pay | Admitting: Emergency Medicine

## 2018-05-16 ENCOUNTER — Other Ambulatory Visit: Payer: Self-pay

## 2018-05-16 DIAGNOSIS — I2510 Atherosclerotic heart disease of native coronary artery without angina pectoris: Secondary | ICD-10-CM | POA: Insufficient documentation

## 2018-05-16 DIAGNOSIS — Z79899 Other long term (current) drug therapy: Secondary | ICD-10-CM | POA: Diagnosis not present

## 2018-05-16 DIAGNOSIS — J101 Influenza due to other identified influenza virus with other respiratory manifestations: Secondary | ICD-10-CM

## 2018-05-16 DIAGNOSIS — I10 Essential (primary) hypertension: Secondary | ICD-10-CM | POA: Diagnosis not present

## 2018-05-16 DIAGNOSIS — R05 Cough: Secondary | ICD-10-CM | POA: Diagnosis not present

## 2018-05-16 DIAGNOSIS — E039 Hypothyroidism, unspecified: Secondary | ICD-10-CM | POA: Insufficient documentation

## 2018-05-16 LAB — URINALYSIS, ROUTINE W REFLEX MICROSCOPIC
BACTERIA UA: NONE SEEN
Bilirubin Urine: NEGATIVE
GLUCOSE, UA: NEGATIVE mg/dL
KETONES UR: NEGATIVE mg/dL
Leukocytes, UA: NEGATIVE
Nitrite: NEGATIVE
PH: 5 (ref 5.0–8.0)
Protein, ur: NEGATIVE mg/dL
Specific Gravity, Urine: 1.019 (ref 1.005–1.030)

## 2018-05-16 LAB — INFLUENZA PANEL BY PCR (TYPE A & B)
INFLAPCR: POSITIVE — AB
INFLBPCR: NEGATIVE

## 2018-05-16 MED ORDER — ACETAMINOPHEN 500 MG PO TABS
1000.0000 mg | ORAL_TABLET | Freq: Once | ORAL | Status: AC
  Administered 2018-05-16: 1000 mg via ORAL

## 2018-05-16 MED ORDER — ACETAMINOPHEN 500 MG PO TABS
ORAL_TABLET | ORAL | Status: AC
  Filled 2018-05-16: qty 2

## 2018-05-16 NOTE — Discharge Instructions (Addendum)
Rest and make sure you are drinking plenty of fluids.  I recommend taking Tylenol or Motrin for fever reduction.  If you take Tylenol every 6 hours and your fever spikes before your next dose it would be okay to take ibuprofen (motrin or advil) 3 hours between your Tylenol doses.  Get rechecked for any worsening symptoms as described above plan to take lots of naps as you recover from this illness.  Expect the flu to last for approximately 1 week.

## 2018-05-16 NOTE — ED Notes (Signed)
Patient reports productive cough with thick yellow sputum that started yesterday, febrile. Patient also states he has had periods of urinary incontinence over the past few days which is different from his baseline.

## 2018-05-16 NOTE — ED Triage Notes (Signed)
Flu like symptoms since yesterday. Pt treating fever every 4 hours with advil. Last advil was 4 hours ago per pt.

## 2018-05-17 NOTE — ED Provider Notes (Signed)
[facility] EMERGENCY DEPARTMENT Provider Note   CSN: [id number] Arrival date & time: 05/16/18  3220     History   Chief Complaint Chief Complaint  Patient presents with  . Cough    HPI [name] [name] is a 68 y.o. male with a history as outlined below, presenting with flulike symptoms.  He describes rather sudden onset of feeling poorly yesterday evening, describing generalized body aches along with fevers and chills, had generalized fatigue and a nonproductive cough.  He also endorses rhinorrhea without significant nasal congestion.  His fever has been subjective, as he does not have a thermometer at home.  He has been taking ibuprofen every 4 hours some improvement in his symptoms.  He has had no shortness of breath, chest pain, nausea, vomiting or diarrhea.  He denies of direct hand or dysuria and no decreased urinary frequency.  He has been around a lot of people, to his knowledge he is not having exposed to influenza.  He did not have a flu vaccine this year.  The history is provided by the patient.    Past Medical History:  Diagnosis Date  . Anxiety state, unspecified   . Backache, unspecified   . Coronary artery disease   . Cramp of limb   . Depressive disorder, not elsewhere classified   . Disturbance of skin sensation   . Esophageal reflux   . Gout, unspecified   . Hyperlipidemia   . Hypertension   . Hypertonicity of bladder   . Irritable bowel syndrome   . Lumbago   . Obesity, unspecified   . Old myocardial infarction   . Open wound of hand except finger(s) alone, without mention of complication   . Osteoarthrosis, unspecified whether generalized or localized, unspecified site   . Other specified visual disturbances   . Pain in joint, lower leg   . Peptic ulcer, unspecified site, unspecified as acute or chronic, without mention of hemorrhage, perforation, or obstruction   . Psychosexual dysfunction with inhibited sexual excitement   . Sinus bradycardia   .  Unspecified constipation   . Unspecified hypertrophic and atrophic condition of skin   . Unspecified hypothyroidism     Patient Active Problem List   Diagnosis Date Noted  . MURMUR 12/08/2009  . CHEST PAIN-UNSPECIFIED 12/08/2009  . SINUS BRADYCARDIA 12/12/2008  . ALLERGIC RHINITIS, SEASONAL 10/13/2008  . CORONARY ATHEROSCLEROSIS NATIVE CORONARY ARTERY 04/01/2008  . GOUT NOS 01/17/2007  . BACK PAIN 01/17/2007  . OBESITY NOS 10/19/2006  . ERECTILE DYSFUNCTION 10/19/2006  . HYPOTHYROIDISM 04/09/2006  . HYPERLIPIDEMIA 04/09/2006  . ANXIETY 04/09/2006  . DEPRESSION 04/09/2006  . HYPERTENSION 04/09/2006  . ASTHMA 04/09/2006  . GERD 04/09/2006  . PUD 04/09/2006  . CONSTIPATION NOS 04/09/2006  . IBS 04/09/2006  . OVERACTIVE BLADDER 04/09/2006  . OSTEOARTHRITIS 04/09/2006  . LOW BACK PAIN 04/09/2006    Past Surgical History:  Procedure Laterality Date  . CARDIAC CATHETERIZATION  2005  . WISDOM TOOTH EXTRACTION          Home Medications    Prior to Admission medications   Medication Sig Start Date End Date Taking? Authorizing Provider  acetaminophen-codeine (TYLENOL #3) 300-30 MG per tablet Take 1 tablet by mouth every 6 (six) hours as needed for moderate pain.    [provider]  indomethacin (INDOCIN) 25 MG capsule Take 25 mg by mouth 2 (two) times daily.    [provider]  levothyroxine (SYNTHROID, LEVOTHROID) 75 MCG tablet Take 75 mcg by mouth daily.  [provider]    Family History Family History  Problem Relation Age of Onset  . Alzheimer's disease Mother   . Hypertension Mother   . Lung cancer Father   . Hypertension Brother   . Coronary artery disease Brother   . Diabetes Brother     Social History Social History   Tobacco Use  . Smoking status: Never Smoker  Substance Use Topics  . Alcohol use: No  . Drug use: No     Allergies   Celecoxib; Clopidogrel bisulfate; and Ramipril   Review of Systems Review of  Systems  Constitutional: Positive for chills and fever.  HENT: Positive for rhinorrhea and sore throat. Negative for congestion, ear pain, sinus pressure, trouble swallowing and voice change.   Eyes: Negative for discharge.  Respiratory: Positive for cough. Negative for shortness of breath, wheezing and stridor.   Cardiovascular: Negative for chest pain.  Gastrointestinal: Negative for abdominal pain.  Genitourinary: Negative.   Musculoskeletal: Positive for myalgias.     Physical Exam Updated Vital Signs BP 122/69 (BP Location: Right Arm)   Pulse 66   Temp 100.2 F (37.9 C) (Oral)   Resp 16   SpO2 96%   Physical Exam Vitals signs and nursing note reviewed.  Constitutional:      Appearance: He is well-developed.  HENT:     Head: Normocephalic and atraumatic.     Right Ear: Tympanic membrane normal.     Left Ear: Tympanic membrane normal.     Nose: Rhinorrhea present. No congestion.     Mouth/Throat:     Mouth: Mucous membranes are moist.     Pharynx: No posterior oropharyngeal erythema.  Eyes:     Conjunctiva/sclera: Conjunctivae normal.  Neck:     Musculoskeletal: Normal range of motion.  Cardiovascular:     Rate and Rhythm: Normal rate and regular rhythm.     Heart sounds: Normal heart sounds.  Pulmonary:     Effort: Pulmonary effort is normal.     Breath sounds: Normal breath sounds. No wheezing.  Abdominal:     General: Bowel sounds are normal.     Palpations: Abdomen is soft.     Tenderness: There is no abdominal tenderness.  Musculoskeletal: Normal range of motion.  Skin:    General: Skin is warm and dry.  Neurological:     Mental Status: He is alert.      ED Treatments / Results  Labs (all labs ordered are listed, but only abnormal results are displayed) Labs Reviewed  INFLUENZA PANEL BY PCR (TYPE A & B) - Abnormal; Notable for the following components:      Result Value   Influenza A By PCR POSITIVE (*)    All other components within normal  limits  URINALYSIS, ROUTINE W REFLEX MICROSCOPIC - Abnormal; Notable for the following components:   Hgb urine dipstick MODERATE (*)    All other components within normal limits    EKG None  Radiology Dg Chest 2 View  Result Date: 05/16/2018 CLINICAL DATA:  Cough  Fever Frequent urination EXAM: CHEST - 2 VIEW COMPARISON:  07/24/2011 FINDINGS: Coarse bronchovascular markings with suggestion of some cephalization of blood flow. No overt interstitial edema or focal infiltrate. Heart size upper limits normal. Aortic Atherosclerosis (ICD10-170.0). No effusion. Anterior vertebral endplate spurring at multiple levels in the mid thoracic spine. IMPRESSION: No acute cardiopulmonary disease. Electronically Signed   By: Lucrezia Europe M.D.   On: 05/16/2018 11:28    Procedures Procedures (including critical  care time)  Medications Ordered in ED Medications  acetaminophen (TYLENOL) tablet 1,000 mg (1,000 mg Oral Given 05/16/18 1056)     Initial Impression / Assessment and Plan / ED Course  I have reviewed the triage vital signs and the nursing notes.  Pertinent labs & imaging results that were available during my care of the patient were reviewed by me and considered in my medical decision making (see chart for details).     Patient has influenza A.  He is within the lateral for Tamiflu treatment.  We discussed this medication and patient defers at this time.  He prefers treating his fever, resting and awaiting resolution of his symptoms.  He was advised that he can alternate Tylenol and ibuprofen every 3 hours for better symptom relief.  Strict return precautions and/or follow-up with his PCP as needed was recommended.  Patient is alert, stable at time of discharge.  Final Clinical Impressions(s) / ED Diagnoses   Final diagnoses:  Influenza A    ED Discharge Orders    None       Landis Martins 05/17/18 1340    Long, W[facility], MD 05/17/18 [phone]

## 2018-05-21 DIAGNOSIS — I10 Essential (primary) hypertension: Secondary | ICD-10-CM | POA: Diagnosis not present

## 2018-05-21 DIAGNOSIS — R7301 Impaired fasting glucose: Secondary | ICD-10-CM | POA: Diagnosis not present

## 2018-05-21 DIAGNOSIS — E1165 Type 2 diabetes mellitus with hyperglycemia: Secondary | ICD-10-CM | POA: Diagnosis not present

## 2018-05-21 DIAGNOSIS — E039 Hypothyroidism, unspecified: Secondary | ICD-10-CM | POA: Diagnosis not present

## 2018-05-21 DIAGNOSIS — E782 Mixed hyperlipidemia: Secondary | ICD-10-CM | POA: Diagnosis not present

## 2018-05-21 DIAGNOSIS — E1169 Type 2 diabetes mellitus with other specified complication: Secondary | ICD-10-CM | POA: Diagnosis not present

## 2018-05-21 DIAGNOSIS — D509 Iron deficiency anemia, unspecified: Secondary | ICD-10-CM | POA: Diagnosis not present

## 2018-05-21 DIAGNOSIS — M1000 Idiopathic gout, unspecified site: Secondary | ICD-10-CM | POA: Diagnosis not present

## 2018-05-24 DIAGNOSIS — G9009 Other idiopathic peripheral autonomic neuropathy: Secondary | ICD-10-CM | POA: Diagnosis not present

## 2018-05-24 DIAGNOSIS — M1000 Idiopathic gout, unspecified site: Secondary | ICD-10-CM | POA: Diagnosis not present

## 2018-05-24 DIAGNOSIS — M1990 Unspecified osteoarthritis, unspecified site: Secondary | ICD-10-CM | POA: Diagnosis not present

## 2018-05-24 DIAGNOSIS — I10 Essential (primary) hypertension: Secondary | ICD-10-CM | POA: Diagnosis not present

## 2018-05-24 DIAGNOSIS — G3184 Mild cognitive impairment, so stated: Secondary | ICD-10-CM | POA: Diagnosis not present

## 2018-05-24 DIAGNOSIS — E1140 Type 2 diabetes mellitus with diabetic neuropathy, unspecified: Secondary | ICD-10-CM | POA: Diagnosis not present

## 2018-05-24 DIAGNOSIS — E039 Hypothyroidism, unspecified: Secondary | ICD-10-CM | POA: Diagnosis not present

## 2018-05-24 DIAGNOSIS — E782 Mixed hyperlipidemia: Secondary | ICD-10-CM | POA: Diagnosis not present

## 2018-07-16 DIAGNOSIS — E782 Mixed hyperlipidemia: Secondary | ICD-10-CM | POA: Diagnosis not present

## 2018-07-16 DIAGNOSIS — I10 Essential (primary) hypertension: Secondary | ICD-10-CM | POA: Diagnosis not present

## 2018-07-16 DIAGNOSIS — D649 Anemia, unspecified: Secondary | ICD-10-CM | POA: Diagnosis not present

## 2018-07-16 DIAGNOSIS — E039 Hypothyroidism, unspecified: Secondary | ICD-10-CM | POA: Diagnosis not present

## 2018-07-16 DIAGNOSIS — E1140 Type 2 diabetes mellitus with diabetic neuropathy, unspecified: Secondary | ICD-10-CM | POA: Diagnosis not present

## 2018-07-16 DIAGNOSIS — M1000 Idiopathic gout, unspecified site: Secondary | ICD-10-CM | POA: Diagnosis not present

## 2018-08-27 DIAGNOSIS — E039 Hypothyroidism, unspecified: Secondary | ICD-10-CM | POA: Diagnosis not present

## 2018-08-27 DIAGNOSIS — E1140 Type 2 diabetes mellitus with diabetic neuropathy, unspecified: Secondary | ICD-10-CM | POA: Diagnosis not present

## 2018-08-27 DIAGNOSIS — I10 Essential (primary) hypertension: Secondary | ICD-10-CM | POA: Diagnosis not present

## 2018-08-27 DIAGNOSIS — D509 Iron deficiency anemia, unspecified: Secondary | ICD-10-CM | POA: Diagnosis not present

## 2018-08-27 DIAGNOSIS — E782 Mixed hyperlipidemia: Secondary | ICD-10-CM | POA: Diagnosis not present

## 2018-08-27 DIAGNOSIS — E1165 Type 2 diabetes mellitus with hyperglycemia: Secondary | ICD-10-CM | POA: Diagnosis not present

## 2018-08-27 DIAGNOSIS — E1169 Type 2 diabetes mellitus with other specified complication: Secondary | ICD-10-CM | POA: Diagnosis not present

## 2018-08-27 DIAGNOSIS — D649 Anemia, unspecified: Secondary | ICD-10-CM | POA: Diagnosis not present

## 2018-08-30 DIAGNOSIS — G3184 Mild cognitive impairment, so stated: Secondary | ICD-10-CM | POA: Diagnosis not present

## 2018-08-30 DIAGNOSIS — E1140 Type 2 diabetes mellitus with diabetic neuropathy, unspecified: Secondary | ICD-10-CM | POA: Diagnosis not present

## 2018-08-30 DIAGNOSIS — E039 Hypothyroidism, unspecified: Secondary | ICD-10-CM | POA: Diagnosis not present

## 2018-08-30 DIAGNOSIS — E782 Mixed hyperlipidemia: Secondary | ICD-10-CM | POA: Diagnosis not present

## 2018-08-30 DIAGNOSIS — D509 Iron deficiency anemia, unspecified: Secondary | ICD-10-CM | POA: Diagnosis not present

## 2018-08-30 DIAGNOSIS — I10 Essential (primary) hypertension: Secondary | ICD-10-CM | POA: Diagnosis not present

## 2018-08-30 DIAGNOSIS — M1A00X0 Idiopathic chronic gout, unspecified site, without tophus (tophi): Secondary | ICD-10-CM | POA: Diagnosis not present

## 2018-08-30 DIAGNOSIS — G9009 Other idiopathic peripheral autonomic neuropathy: Secondary | ICD-10-CM | POA: Diagnosis not present

## 2018-09-11 DIAGNOSIS — I10 Essential (primary) hypertension: Secondary | ICD-10-CM | POA: Diagnosis not present

## 2018-09-11 DIAGNOSIS — E782 Mixed hyperlipidemia: Secondary | ICD-10-CM | POA: Diagnosis not present

## 2018-09-11 DIAGNOSIS — M1000 Idiopathic gout, unspecified site: Secondary | ICD-10-CM | POA: Diagnosis not present

## 2018-09-11 DIAGNOSIS — E1140 Type 2 diabetes mellitus with diabetic neuropathy, unspecified: Secondary | ICD-10-CM | POA: Diagnosis not present

## 2018-09-24 DIAGNOSIS — M1000 Idiopathic gout, unspecified site: Secondary | ICD-10-CM | POA: Diagnosis not present

## 2018-09-24 DIAGNOSIS — I10 Essential (primary) hypertension: Secondary | ICD-10-CM | POA: Diagnosis not present

## 2018-09-24 DIAGNOSIS — E039 Hypothyroidism, unspecified: Secondary | ICD-10-CM | POA: Diagnosis not present

## 2018-09-24 DIAGNOSIS — E1140 Type 2 diabetes mellitus with diabetic neuropathy, unspecified: Secondary | ICD-10-CM | POA: Diagnosis not present

## 2018-09-24 DIAGNOSIS — E782 Mixed hyperlipidemia: Secondary | ICD-10-CM | POA: Diagnosis not present

## 2018-09-24 DIAGNOSIS — D649 Anemia, unspecified: Secondary | ICD-10-CM | POA: Diagnosis not present

## 2018-11-01 DIAGNOSIS — D649 Anemia, unspecified: Secondary | ICD-10-CM | POA: Diagnosis not present

## 2018-11-01 DIAGNOSIS — E1140 Type 2 diabetes mellitus with diabetic neuropathy, unspecified: Secondary | ICD-10-CM | POA: Diagnosis not present

## 2018-11-01 DIAGNOSIS — I10 Essential (primary) hypertension: Secondary | ICD-10-CM | POA: Diagnosis not present

## 2018-11-01 DIAGNOSIS — M1000 Idiopathic gout, unspecified site: Secondary | ICD-10-CM | POA: Diagnosis not present

## 2018-11-01 DIAGNOSIS — E039 Hypothyroidism, unspecified: Secondary | ICD-10-CM | POA: Diagnosis not present

## 2018-11-01 DIAGNOSIS — E782 Mixed hyperlipidemia: Secondary | ICD-10-CM | POA: Diagnosis not present

## 2018-11-15 DIAGNOSIS — Z0100 Encounter for examination of eyes and vision without abnormal findings: Secondary | ICD-10-CM | POA: Diagnosis not present

## 2018-12-11 DIAGNOSIS — E782 Mixed hyperlipidemia: Secondary | ICD-10-CM | POA: Diagnosis not present

## 2018-12-11 DIAGNOSIS — M1000 Idiopathic gout, unspecified site: Secondary | ICD-10-CM | POA: Diagnosis not present

## 2018-12-11 DIAGNOSIS — E039 Hypothyroidism, unspecified: Secondary | ICD-10-CM | POA: Diagnosis not present

## 2018-12-11 DIAGNOSIS — E1140 Type 2 diabetes mellitus with diabetic neuropathy, unspecified: Secondary | ICD-10-CM | POA: Diagnosis not present

## 2018-12-11 DIAGNOSIS — I10 Essential (primary) hypertension: Secondary | ICD-10-CM | POA: Diagnosis not present

## 2018-12-11 DIAGNOSIS — D649 Anemia, unspecified: Secondary | ICD-10-CM | POA: Diagnosis not present

## 2019-01-24 DIAGNOSIS — M1000 Idiopathic gout, unspecified site: Secondary | ICD-10-CM | POA: Diagnosis not present

## 2019-01-24 DIAGNOSIS — M1990 Unspecified osteoarthritis, unspecified site: Secondary | ICD-10-CM | POA: Diagnosis not present

## 2019-01-24 DIAGNOSIS — N400 Enlarged prostate without lower urinary tract symptoms: Secondary | ICD-10-CM | POA: Diagnosis not present

## 2019-01-24 DIAGNOSIS — Z0000 Encounter for general adult medical examination without abnormal findings: Secondary | ICD-10-CM | POA: Diagnosis not present

## 2019-01-24 DIAGNOSIS — G3184 Mild cognitive impairment, so stated: Secondary | ICD-10-CM | POA: Diagnosis not present

## 2019-01-24 DIAGNOSIS — D509 Iron deficiency anemia, unspecified: Secondary | ICD-10-CM | POA: Diagnosis not present

## 2019-01-24 DIAGNOSIS — E039 Hypothyroidism, unspecified: Secondary | ICD-10-CM | POA: Diagnosis not present

## 2019-01-24 DIAGNOSIS — E1165 Type 2 diabetes mellitus with hyperglycemia: Secondary | ICD-10-CM | POA: Diagnosis not present

## 2019-01-24 DIAGNOSIS — R7301 Impaired fasting glucose: Secondary | ICD-10-CM | POA: Diagnosis not present

## 2019-01-28 DIAGNOSIS — G9009 Other idiopathic peripheral autonomic neuropathy: Secondary | ICD-10-CM | POA: Diagnosis not present

## 2019-01-28 DIAGNOSIS — I10 Essential (primary) hypertension: Secondary | ICD-10-CM | POA: Diagnosis not present

## 2019-01-28 DIAGNOSIS — E1140 Type 2 diabetes mellitus with diabetic neuropathy, unspecified: Secondary | ICD-10-CM | POA: Diagnosis not present

## 2019-01-28 DIAGNOSIS — M1A00X0 Idiopathic chronic gout, unspecified site, without tophus (tophi): Secondary | ICD-10-CM | POA: Diagnosis not present

## 2019-01-28 DIAGNOSIS — D509 Iron deficiency anemia, unspecified: Secondary | ICD-10-CM | POA: Diagnosis not present

## 2019-01-28 DIAGNOSIS — E782 Mixed hyperlipidemia: Secondary | ICD-10-CM | POA: Diagnosis not present

## 2019-01-28 DIAGNOSIS — E039 Hypothyroidism, unspecified: Secondary | ICD-10-CM | POA: Diagnosis not present

## 2019-01-28 DIAGNOSIS — G3184 Mild cognitive impairment, so stated: Secondary | ICD-10-CM | POA: Diagnosis not present

## 2019-02-05 DIAGNOSIS — E782 Mixed hyperlipidemia: Secondary | ICD-10-CM | POA: Diagnosis not present

## 2019-02-05 DIAGNOSIS — E039 Hypothyroidism, unspecified: Secondary | ICD-10-CM | POA: Diagnosis not present

## 2019-02-05 DIAGNOSIS — D649 Anemia, unspecified: Secondary | ICD-10-CM | POA: Diagnosis not present

## 2019-02-05 DIAGNOSIS — M1000 Idiopathic gout, unspecified site: Secondary | ICD-10-CM | POA: Diagnosis not present

## 2019-02-05 DIAGNOSIS — I10 Essential (primary) hypertension: Secondary | ICD-10-CM | POA: Diagnosis not present

## 2019-02-05 DIAGNOSIS — E1140 Type 2 diabetes mellitus with diabetic neuropathy, unspecified: Secondary | ICD-10-CM | POA: Diagnosis not present

## 2019-03-27 DIAGNOSIS — Z23 Encounter for immunization: Secondary | ICD-10-CM | POA: Diagnosis not present

## 2019-05-10 DIAGNOSIS — D509 Iron deficiency anemia, unspecified: Secondary | ICD-10-CM | POA: Diagnosis not present

## 2019-05-10 DIAGNOSIS — E782 Mixed hyperlipidemia: Secondary | ICD-10-CM | POA: Diagnosis not present

## 2019-05-10 DIAGNOSIS — M1000 Idiopathic gout, unspecified site: Secondary | ICD-10-CM | POA: Diagnosis not present

## 2019-05-10 DIAGNOSIS — E1140 Type 2 diabetes mellitus with diabetic neuropathy, unspecified: Secondary | ICD-10-CM | POA: Diagnosis not present

## 2019-05-10 DIAGNOSIS — E1169 Type 2 diabetes mellitus with other specified complication: Secondary | ICD-10-CM | POA: Diagnosis not present

## 2019-05-10 DIAGNOSIS — Z0000 Encounter for general adult medical examination without abnormal findings: Secondary | ICD-10-CM | POA: Diagnosis not present

## 2019-05-10 DIAGNOSIS — I10 Essential (primary) hypertension: Secondary | ICD-10-CM | POA: Diagnosis not present

## 2019-05-10 DIAGNOSIS — Z125 Encounter for screening for malignant neoplasm of prostate: Secondary | ICD-10-CM | POA: Diagnosis not present

## 2019-05-10 DIAGNOSIS — E039 Hypothyroidism, unspecified: Secondary | ICD-10-CM | POA: Diagnosis not present

## 2019-05-10 DIAGNOSIS — E1165 Type 2 diabetes mellitus with hyperglycemia: Secondary | ICD-10-CM | POA: Diagnosis not present

## 2019-05-14 DIAGNOSIS — E7849 Other hyperlipidemia: Secondary | ICD-10-CM | POA: Diagnosis not present

## 2019-05-14 DIAGNOSIS — Z0001 Encounter for general adult medical examination with abnormal findings: Secondary | ICD-10-CM | POA: Diagnosis not present

## 2019-05-14 DIAGNOSIS — G9009 Other idiopathic peripheral autonomic neuropathy: Secondary | ICD-10-CM | POA: Diagnosis not present

## 2019-05-14 DIAGNOSIS — E039 Hypothyroidism, unspecified: Secondary | ICD-10-CM | POA: Diagnosis not present

## 2019-05-14 DIAGNOSIS — M1A00X0 Idiopathic chronic gout, unspecified site, without tophus (tophi): Secondary | ICD-10-CM | POA: Diagnosis not present

## 2019-05-14 DIAGNOSIS — E782 Mixed hyperlipidemia: Secondary | ICD-10-CM | POA: Diagnosis not present

## 2019-05-14 DIAGNOSIS — M1000 Idiopathic gout, unspecified site: Secondary | ICD-10-CM | POA: Diagnosis not present

## 2019-05-14 DIAGNOSIS — G3184 Mild cognitive impairment, so stated: Secondary | ICD-10-CM | POA: Diagnosis not present

## 2019-05-14 DIAGNOSIS — E1140 Type 2 diabetes mellitus with diabetic neuropathy, unspecified: Secondary | ICD-10-CM | POA: Diagnosis not present

## 2019-05-14 DIAGNOSIS — I10 Essential (primary) hypertension: Secondary | ICD-10-CM | POA: Diagnosis not present

## 2019-05-14 DIAGNOSIS — D509 Iron deficiency anemia, unspecified: Secondary | ICD-10-CM | POA: Diagnosis not present

## 2019-05-14 DIAGNOSIS — D649 Anemia, unspecified: Secondary | ICD-10-CM | POA: Diagnosis not present

## 2019-07-04 DIAGNOSIS — I739 Peripheral vascular disease, unspecified: Secondary | ICD-10-CM | POA: Diagnosis not present

## 2019-07-04 DIAGNOSIS — R600 Localized edema: Secondary | ICD-10-CM | POA: Diagnosis not present

## 2019-07-04 DIAGNOSIS — M1000 Idiopathic gout, unspecified site: Secondary | ICD-10-CM | POA: Diagnosis not present

## 2019-07-04 DIAGNOSIS — M25571 Pain in right ankle and joints of right foot: Secondary | ICD-10-CM | POA: Diagnosis not present

## 2019-07-05 ENCOUNTER — Ambulatory Visit: Payer: Medicare HMO | Attending: Internal Medicine

## 2019-07-05 DIAGNOSIS — Z23 Encounter for immunization: Secondary | ICD-10-CM

## 2019-07-05 NOTE — Progress Notes (Signed)
   Covid-19 Vaccination Clinic  Name:  [name] [name]    MRN: [id number] DOB: Mar 17, 1951  07/05/2019  [name] was observed post Covid-19 immunization for 15 minutes without incident. He was provided with Vaccine Information Sheet and instruction to access the V-Safe system.   [name] was instructed to call 911 with any severe reactions post vaccine: Marland Kitchen Difficulty breathing  . Swelling of face and throat  . A fast heartbeat  . A bad rash all over body  . Dizziness and weakness   Immunizations Administered    Name Date Dose VIS Date Route   Moderna COVID-19 Vaccine 07/05/2019  9:50 AM 0.5 mL 03/26/2019 Intramuscular   Manufacturer: Moderna   Lot: [id number]   [id number]

## 2019-07-09 ENCOUNTER — Other Ambulatory Visit (HOSPITAL_COMMUNITY): Payer: Self-pay | Admitting: Internal Medicine

## 2019-07-09 ENCOUNTER — Other Ambulatory Visit: Payer: Self-pay | Admitting: Internal Medicine

## 2019-07-09 DIAGNOSIS — I739 Peripheral vascular disease, unspecified: Secondary | ICD-10-CM

## 2019-08-07 ENCOUNTER — Ambulatory Visit: Payer: Medicare HMO | Attending: Internal Medicine

## 2019-08-07 DIAGNOSIS — Z23 Encounter for immunization: Secondary | ICD-10-CM

## 2019-08-07 NOTE — Progress Notes (Signed)
   Covid-19 Vaccination Clinic  Name:  [name] [name]    MRN: [id number] DOB: 06-07-1950  08/07/2019  [name] was observed post Covid-19 immunization for 15 minutes without incident. He was provided with Vaccine Information Sheet and instruction to access the V-Safe system.   [name] was instructed to call 911 with any severe reactions post vaccine: Marland Kitchen Difficulty breathing  . Swelling of face and throat  . A fast heartbeat  . A bad rash all over body  . Dizziness and weakness   Immunizations Administered    Name Date Dose VIS Date Route   Moderna COVID-19 Vaccine 08/07/2019 11:09 AM 0.5 mL 03/26/2019 Intramuscular   Manufacturer: Moderna   Lot: [id number]   [id number]

## 2019-09-11 DIAGNOSIS — Z0001 Encounter for general adult medical examination with abnormal findings: Secondary | ICD-10-CM | POA: Diagnosis not present

## 2019-09-11 DIAGNOSIS — Z0000 Encounter for general adult medical examination without abnormal findings: Secondary | ICD-10-CM | POA: Diagnosis not present

## 2019-09-11 DIAGNOSIS — E1165 Type 2 diabetes mellitus with hyperglycemia: Secondary | ICD-10-CM | POA: Diagnosis not present

## 2019-09-11 DIAGNOSIS — E1169 Type 2 diabetes mellitus with other specified complication: Secondary | ICD-10-CM | POA: Diagnosis not present

## 2019-09-11 DIAGNOSIS — B379 Candidiasis, unspecified: Secondary | ICD-10-CM | POA: Diagnosis not present

## 2019-09-11 DIAGNOSIS — D509 Iron deficiency anemia, unspecified: Secondary | ICD-10-CM | POA: Diagnosis not present

## 2019-09-11 DIAGNOSIS — E1140 Type 2 diabetes mellitus with diabetic neuropathy, unspecified: Secondary | ICD-10-CM | POA: Diagnosis not present

## 2019-09-11 DIAGNOSIS — E039 Hypothyroidism, unspecified: Secondary | ICD-10-CM | POA: Diagnosis not present

## 2019-09-11 DIAGNOSIS — D649 Anemia, unspecified: Secondary | ICD-10-CM | POA: Diagnosis not present

## 2019-09-16 DIAGNOSIS — G3184 Mild cognitive impairment, so stated: Secondary | ICD-10-CM | POA: Diagnosis not present

## 2019-09-16 DIAGNOSIS — G9009 Other idiopathic peripheral autonomic neuropathy: Secondary | ICD-10-CM | POA: Diagnosis not present

## 2019-09-16 DIAGNOSIS — E039 Hypothyroidism, unspecified: Secondary | ICD-10-CM | POA: Diagnosis not present

## 2019-09-16 DIAGNOSIS — E782 Mixed hyperlipidemia: Secondary | ICD-10-CM | POA: Diagnosis not present

## 2019-09-16 DIAGNOSIS — D509 Iron deficiency anemia, unspecified: Secondary | ICD-10-CM | POA: Diagnosis not present

## 2019-09-16 DIAGNOSIS — Z0001 Encounter for general adult medical examination with abnormal findings: Secondary | ICD-10-CM | POA: Diagnosis not present

## 2019-09-16 DIAGNOSIS — M1A00X0 Idiopathic chronic gout, unspecified site, without tophus (tophi): Secondary | ICD-10-CM | POA: Diagnosis not present

## 2019-09-16 DIAGNOSIS — E1140 Type 2 diabetes mellitus with diabetic neuropathy, unspecified: Secondary | ICD-10-CM | POA: Diagnosis not present

## 2019-09-16 DIAGNOSIS — I10 Essential (primary) hypertension: Secondary | ICD-10-CM | POA: Diagnosis not present

## 2019-10-01 ENCOUNTER — Ambulatory Visit (HOSPITAL_COMMUNITY)
Admission: RE | Admit: 2019-10-01 | Discharge: 2019-10-01 | Disposition: A | Discharge status: Home / Self Care | Payer: Medicare HMO | Source: Ambulatory Visit | Attending: Internal Medicine | Admitting: Internal Medicine

## 2019-10-01 ENCOUNTER — Other Ambulatory Visit: Payer: Self-pay

## 2019-10-01 DIAGNOSIS — I739 Peripheral vascular disease, unspecified: Secondary | ICD-10-CM | POA: Diagnosis not present

## 2019-10-01 DIAGNOSIS — E785 Hyperlipidemia, unspecified: Secondary | ICD-10-CM | POA: Diagnosis not present

## 2019-10-08 DIAGNOSIS — L603 Nail dystrophy: Secondary | ICD-10-CM | POA: Diagnosis not present

## 2019-10-08 DIAGNOSIS — R600 Localized edema: Secondary | ICD-10-CM | POA: Diagnosis not present

## 2019-10-08 DIAGNOSIS — I739 Peripheral vascular disease, unspecified: Secondary | ICD-10-CM | POA: Diagnosis not present

## 2019-11-22 DIAGNOSIS — D649 Anemia, unspecified: Secondary | ICD-10-CM | POA: Diagnosis not present

## 2019-11-22 DIAGNOSIS — M1000 Idiopathic gout, unspecified site: Secondary | ICD-10-CM | POA: Diagnosis not present

## 2019-11-22 DIAGNOSIS — E1140 Type 2 diabetes mellitus with diabetic neuropathy, unspecified: Secondary | ICD-10-CM | POA: Diagnosis not present

## 2019-11-22 DIAGNOSIS — E039 Hypothyroidism, unspecified: Secondary | ICD-10-CM | POA: Diagnosis not present

## 2019-11-22 DIAGNOSIS — I10 Essential (primary) hypertension: Secondary | ICD-10-CM | POA: Diagnosis not present

## 2019-11-22 DIAGNOSIS — E7849 Other hyperlipidemia: Secondary | ICD-10-CM | POA: Diagnosis not present

## 2019-11-28 DIAGNOSIS — D649 Anemia, unspecified: Secondary | ICD-10-CM | POA: Diagnosis not present

## 2019-11-28 DIAGNOSIS — I10 Essential (primary) hypertension: Secondary | ICD-10-CM | POA: Diagnosis not present

## 2019-11-28 DIAGNOSIS — E7849 Other hyperlipidemia: Secondary | ICD-10-CM | POA: Diagnosis not present

## 2019-11-28 DIAGNOSIS — M1000 Idiopathic gout, unspecified site: Secondary | ICD-10-CM | POA: Diagnosis not present

## 2019-11-28 DIAGNOSIS — E1140 Type 2 diabetes mellitus with diabetic neuropathy, unspecified: Secondary | ICD-10-CM | POA: Diagnosis not present

## 2019-11-28 DIAGNOSIS — E039 Hypothyroidism, unspecified: Secondary | ICD-10-CM | POA: Diagnosis not present

## 2019-12-27 DIAGNOSIS — Z0001 Encounter for general adult medical examination with abnormal findings: Secondary | ICD-10-CM | POA: Diagnosis not present

## 2019-12-27 DIAGNOSIS — N400 Enlarged prostate without lower urinary tract symptoms: Secondary | ICD-10-CM | POA: Diagnosis not present

## 2019-12-27 DIAGNOSIS — E039 Hypothyroidism, unspecified: Secondary | ICD-10-CM | POA: Diagnosis not present

## 2019-12-27 DIAGNOSIS — Z0000 Encounter for general adult medical examination without abnormal findings: Secondary | ICD-10-CM | POA: Diagnosis not present

## 2019-12-27 DIAGNOSIS — E7849 Other hyperlipidemia: Secondary | ICD-10-CM | POA: Diagnosis not present

## 2019-12-27 DIAGNOSIS — E782 Mixed hyperlipidemia: Secondary | ICD-10-CM | POA: Diagnosis not present

## 2019-12-27 DIAGNOSIS — M1000 Idiopathic gout, unspecified site: Secondary | ICD-10-CM | POA: Diagnosis not present

## 2019-12-27 DIAGNOSIS — E1165 Type 2 diabetes mellitus with hyperglycemia: Secondary | ICD-10-CM | POA: Diagnosis not present

## 2019-12-27 DIAGNOSIS — R21 Rash and other nonspecific skin eruption: Secondary | ICD-10-CM | POA: Diagnosis not present

## 2019-12-27 DIAGNOSIS — D509 Iron deficiency anemia, unspecified: Secondary | ICD-10-CM | POA: Diagnosis not present

## 2019-12-27 DIAGNOSIS — R7301 Impaired fasting glucose: Secondary | ICD-10-CM | POA: Diagnosis not present

## 2019-12-27 DIAGNOSIS — F5221 Male erectile disorder: Secondary | ICD-10-CM | POA: Diagnosis not present

## 2019-12-27 DIAGNOSIS — D649 Anemia, unspecified: Secondary | ICD-10-CM | POA: Diagnosis not present

## 2019-12-27 DIAGNOSIS — M1990 Unspecified osteoarthritis, unspecified site: Secondary | ICD-10-CM | POA: Diagnosis not present

## 2019-12-27 DIAGNOSIS — I10 Essential (primary) hypertension: Secondary | ICD-10-CM | POA: Diagnosis not present

## 2020-01-01 DIAGNOSIS — M1A00X0 Idiopathic chronic gout, unspecified site, without tophus (tophi): Secondary | ICD-10-CM | POA: Diagnosis not present

## 2020-01-01 DIAGNOSIS — G3184 Mild cognitive impairment, so stated: Secondary | ICD-10-CM | POA: Diagnosis not present

## 2020-01-01 DIAGNOSIS — Z23 Encounter for immunization: Secondary | ICD-10-CM | POA: Diagnosis not present

## 2020-01-01 DIAGNOSIS — E039 Hypothyroidism, unspecified: Secondary | ICD-10-CM | POA: Diagnosis not present

## 2020-01-01 DIAGNOSIS — G9009 Other idiopathic peripheral autonomic neuropathy: Secondary | ICD-10-CM | POA: Diagnosis not present

## 2020-01-01 DIAGNOSIS — D509 Iron deficiency anemia, unspecified: Secondary | ICD-10-CM | POA: Diagnosis not present

## 2020-01-01 DIAGNOSIS — E782 Mixed hyperlipidemia: Secondary | ICD-10-CM | POA: Diagnosis not present

## 2020-01-01 DIAGNOSIS — I10 Essential (primary) hypertension: Secondary | ICD-10-CM | POA: Diagnosis not present

## 2020-01-01 DIAGNOSIS — E1140 Type 2 diabetes mellitus with diabetic neuropathy, unspecified: Secondary | ICD-10-CM | POA: Diagnosis not present

## 2020-01-08 DIAGNOSIS — D649 Anemia, unspecified: Secondary | ICD-10-CM | POA: Diagnosis not present

## 2020-01-08 DIAGNOSIS — I10 Essential (primary) hypertension: Secondary | ICD-10-CM | POA: Diagnosis not present

## 2020-01-08 DIAGNOSIS — E039 Hypothyroidism, unspecified: Secondary | ICD-10-CM | POA: Diagnosis not present

## 2020-01-08 DIAGNOSIS — M1000 Idiopathic gout, unspecified site: Secondary | ICD-10-CM | POA: Diagnosis not present

## 2020-01-08 DIAGNOSIS — E1140 Type 2 diabetes mellitus with diabetic neuropathy, unspecified: Secondary | ICD-10-CM | POA: Diagnosis not present

## 2020-01-08 DIAGNOSIS — E7849 Other hyperlipidemia: Secondary | ICD-10-CM | POA: Diagnosis not present

## 2020-01-09 DIAGNOSIS — H5200 Hypermetropia, unspecified eye: Secondary | ICD-10-CM | POA: Diagnosis not present

## 2020-01-31 DIAGNOSIS — E039 Hypothyroidism, unspecified: Secondary | ICD-10-CM | POA: Diagnosis not present

## 2020-01-31 DIAGNOSIS — E7849 Other hyperlipidemia: Secondary | ICD-10-CM | POA: Diagnosis not present

## 2020-01-31 DIAGNOSIS — M1000 Idiopathic gout, unspecified site: Secondary | ICD-10-CM | POA: Diagnosis not present

## 2020-01-31 DIAGNOSIS — D649 Anemia, unspecified: Secondary | ICD-10-CM | POA: Diagnosis not present

## 2020-01-31 DIAGNOSIS — I10 Essential (primary) hypertension: Secondary | ICD-10-CM | POA: Diagnosis not present

## 2020-01-31 DIAGNOSIS — E1140 Type 2 diabetes mellitus with diabetic neuropathy, unspecified: Secondary | ICD-10-CM | POA: Diagnosis not present

## 2020-04-13 DIAGNOSIS — M1990 Unspecified osteoarthritis, unspecified site: Secondary | ICD-10-CM | POA: Diagnosis not present

## 2020-04-13 DIAGNOSIS — Z0000 Encounter for general adult medical examination without abnormal findings: Secondary | ICD-10-CM | POA: Diagnosis not present

## 2020-04-13 DIAGNOSIS — M1000 Idiopathic gout, unspecified site: Secondary | ICD-10-CM | POA: Diagnosis not present

## 2020-04-13 DIAGNOSIS — R7301 Impaired fasting glucose: Secondary | ICD-10-CM | POA: Diagnosis not present

## 2020-04-13 DIAGNOSIS — E1165 Type 2 diabetes mellitus with hyperglycemia: Secondary | ICD-10-CM | POA: Diagnosis not present

## 2020-04-13 DIAGNOSIS — N400 Enlarged prostate without lower urinary tract symptoms: Secondary | ICD-10-CM | POA: Diagnosis not present

## 2020-04-13 DIAGNOSIS — Z0001 Encounter for general adult medical examination with abnormal findings: Secondary | ICD-10-CM | POA: Diagnosis not present

## 2020-04-13 DIAGNOSIS — D509 Iron deficiency anemia, unspecified: Secondary | ICD-10-CM | POA: Diagnosis not present

## 2020-04-13 DIAGNOSIS — E039 Hypothyroidism, unspecified: Secondary | ICD-10-CM | POA: Diagnosis not present

## 2020-04-16 DIAGNOSIS — E1140 Type 2 diabetes mellitus with diabetic neuropathy, unspecified: Secondary | ICD-10-CM | POA: Diagnosis not present

## 2020-04-16 DIAGNOSIS — G3184 Mild cognitive impairment, so stated: Secondary | ICD-10-CM | POA: Diagnosis not present

## 2020-04-16 DIAGNOSIS — I10 Essential (primary) hypertension: Secondary | ICD-10-CM | POA: Diagnosis not present

## 2020-04-16 DIAGNOSIS — E039 Hypothyroidism, unspecified: Secondary | ICD-10-CM | POA: Diagnosis not present

## 2020-04-16 DIAGNOSIS — G9009 Other idiopathic peripheral autonomic neuropathy: Secondary | ICD-10-CM | POA: Diagnosis not present

## 2020-04-16 DIAGNOSIS — E782 Mixed hyperlipidemia: Secondary | ICD-10-CM | POA: Diagnosis not present

## 2020-04-16 DIAGNOSIS — D509 Iron deficiency anemia, unspecified: Secondary | ICD-10-CM | POA: Diagnosis not present

## 2020-04-16 DIAGNOSIS — R600 Localized edema: Secondary | ICD-10-CM | POA: Diagnosis not present

## 2020-04-16 DIAGNOSIS — M1A00X0 Idiopathic chronic gout, unspecified site, without tophus (tophi): Secondary | ICD-10-CM | POA: Diagnosis not present

## 2020-05-23 DIAGNOSIS — G3184 Mild cognitive impairment, so stated: Secondary | ICD-10-CM | POA: Diagnosis not present

## 2020-05-23 DIAGNOSIS — E782 Mixed hyperlipidemia: Secondary | ICD-10-CM | POA: Diagnosis not present

## 2020-05-23 DIAGNOSIS — M1A00X0 Idiopathic chronic gout, unspecified site, without tophus (tophi): Secondary | ICD-10-CM | POA: Diagnosis not present

## 2020-05-23 DIAGNOSIS — I10 Essential (primary) hypertension: Secondary | ICD-10-CM | POA: Diagnosis not present

## 2020-05-23 DIAGNOSIS — G9009 Other idiopathic peripheral autonomic neuropathy: Secondary | ICD-10-CM | POA: Diagnosis not present

## 2020-05-23 DIAGNOSIS — E1140 Type 2 diabetes mellitus with diabetic neuropathy, unspecified: Secondary | ICD-10-CM | POA: Diagnosis not present

## 2020-05-23 DIAGNOSIS — E039 Hypothyroidism, unspecified: Secondary | ICD-10-CM | POA: Diagnosis not present

## 2020-05-23 DIAGNOSIS — R600 Localized edema: Secondary | ICD-10-CM | POA: Diagnosis not present

## 2020-05-23 DIAGNOSIS — D509 Iron deficiency anemia, unspecified: Secondary | ICD-10-CM | POA: Diagnosis not present

## 2020-06-16 ENCOUNTER — Other Ambulatory Visit: Payer: Self-pay

## 2020-06-16 ENCOUNTER — Encounter (HOSPITAL_COMMUNITY): Payer: Self-pay | Admitting: *Deleted

## 2020-06-16 ENCOUNTER — Emergency Department (HOSPITAL_COMMUNITY): Payer: Medicare HMO

## 2020-06-16 ENCOUNTER — Observation Stay (HOSPITAL_COMMUNITY)
Admission: EM | Admit: 2020-06-16 | Discharge: 2020-06-18 | Disposition: A | Discharge status: Home / Self Care | Payer: Medicare HMO | Attending: Internal Medicine | Admitting: Internal Medicine

## 2020-06-16 DIAGNOSIS — J45909 Unspecified asthma, uncomplicated: Secondary | ICD-10-CM | POA: Diagnosis not present

## 2020-06-16 DIAGNOSIS — R519 Headache, unspecified: Secondary | ICD-10-CM | POA: Diagnosis not present

## 2020-06-16 DIAGNOSIS — Z7901 Long term (current) use of anticoagulants: Secondary | ICD-10-CM | POA: Diagnosis not present

## 2020-06-16 DIAGNOSIS — Z20822 Contact with and (suspected) exposure to covid-19: Secondary | ICD-10-CM | POA: Insufficient documentation

## 2020-06-16 DIAGNOSIS — Z79899 Other long term (current) drug therapy: Secondary | ICD-10-CM | POA: Diagnosis not present

## 2020-06-16 DIAGNOSIS — I10 Essential (primary) hypertension: Secondary | ICD-10-CM | POA: Diagnosis not present

## 2020-06-16 DIAGNOSIS — I517 Cardiomegaly: Secondary | ICD-10-CM | POA: Diagnosis not present

## 2020-06-16 DIAGNOSIS — E041 Nontoxic single thyroid nodule: Secondary | ICD-10-CM | POA: Diagnosis not present

## 2020-06-16 DIAGNOSIS — E039 Hypothyroidism, unspecified: Secondary | ICD-10-CM | POA: Diagnosis not present

## 2020-06-16 DIAGNOSIS — M47812 Spondylosis without myelopathy or radiculopathy, cervical region: Secondary | ICD-10-CM | POA: Diagnosis not present

## 2020-06-16 DIAGNOSIS — R200 Anesthesia of skin: Secondary | ICD-10-CM | POA: Diagnosis not present

## 2020-06-16 DIAGNOSIS — R778 Other specified abnormalities of plasma proteins: Secondary | ICD-10-CM | POA: Diagnosis present

## 2020-06-16 DIAGNOSIS — I2510 Atherosclerotic heart disease of native coronary artery without angina pectoris: Secondary | ICD-10-CM | POA: Insufficient documentation

## 2020-06-16 DIAGNOSIS — R0789 Other chest pain: Principal | ICD-10-CM | POA: Insufficient documentation

## 2020-06-16 LAB — COMPREHENSIVE METABOLIC PANEL
ALT: 20 U/L (ref 0–44)
AST: 24 U/L (ref 15–41)
Albumin: 3.7 g/dL (ref 3.5–5.0)
Alkaline Phosphatase: 43 U/L (ref 38–126)
Anion gap: 6 (ref 5–15)
BUN: 18 mg/dL (ref 8–23)
CO2: 25 mmol/L (ref 22–32)
Calcium: 8.9 mg/dL (ref 8.9–10.3)
Chloride: 104 mmol/L (ref 98–111)
Creatinine, Ser: 1.11 mg/dL (ref 0.61–1.24)
GFR, Estimated: >60 mL/min (ref >60)
Glucose, Bld: 97 mg/dL (ref 70–99)
Potassium: 3.8 mmol/L (ref 3.5–5.1)
Sodium: 135 mmol/L (ref 135–145)
Total Bilirubin: 0.4 mg/dL (ref 0.3–1.2)
Total Protein: 7.1 g/dL (ref 6.5–8.1)

## 2020-06-16 LAB — CBC WITH DIFFERENTIAL/PLATELET
Abs Immature Granulocytes: 0.01 10*3/uL (ref 0.00–0.07)
Basophils Absolute: 0 10*3/uL (ref 0.0–0.1)
Basophils Relative: 1 %
Eosinophils Absolute: 0.2 10*3/uL (ref 0.0–0.5)
Eosinophils Relative: 3 %
HCT: 38.2 % — ABNORMAL LOW (ref 39.0–52.0)
Hemoglobin: 12.4 g/dL — ABNORMAL LOW (ref 13.0–17.0)
Immature Granulocytes: 0 %
Lymphocytes Relative: 55 %
Lymphs Abs: 2.7 10*3/uL (ref 0.7–4.0)
MCH: 31.2 pg (ref 26.0–34.0)
MCHC: 32.5 g/dL (ref 30.0–36.0)
MCV: 96 fL (ref 80.0–100.0)
Monocytes Absolute: 0.5 10*3/uL (ref 0.1–1.0)
Monocytes Relative: 10 %
Neutro Abs: 1.5 10*3/uL — ABNORMAL LOW (ref 1.7–7.7)
Neutrophils Relative %: 31 %
Platelets: 311 10*3/uL (ref 150–400)
RBC: 3.98 MIL/uL — ABNORMAL LOW (ref 4.22–5.81)
RDW: 12.8 % (ref 11.5–15.5)
WBC: 4.8 10*3/uL (ref 4.0–10.5)
nRBC: 0 % (ref 0.0–0.2)

## 2020-06-16 LAB — TROPONIN I (HIGH SENSITIVITY)
Troponin I (High Sensitivity): 111 ng/L (ref <18)
Troponin I (High Sensitivity): 113 ng/L (ref <18)

## 2020-06-16 LAB — TSH: TSH: 2.81 u[IU]/mL (ref 0.350–4.500)

## 2020-06-16 MED ORDER — LEVOTHYROXINE SODIUM 25 MCG PO TABS
125.0000 ug | ORAL_TABLET | Freq: Every day | ORAL | Status: DC
  Administered 2020-06-17 – 2020-06-18 (×2): 125 ug via ORAL
  Filled 2020-06-16: qty 1
  Filled 2020-06-16: qty 3

## 2020-06-16 MED ORDER — ACETAMINOPHEN 325 MG PO TABS
650.0000 mg | ORAL_TABLET | ORAL | Status: DC | PRN
  Administered 2020-06-17: 650 mg via ORAL
  Filled 2020-06-16: qty 2

## 2020-06-16 MED ORDER — LEVOTHYROXINE SODIUM 50 MCG PO TABS: 125.0000 ug | ORAL_TABLET | Freq: Every day | ORAL | Status: DC

## 2020-06-16 MED ORDER — ASPIRIN EC 81 MG PO TBEC
81.0000 mg | DELAYED_RELEASE_TABLET | Freq: Every day | ORAL | Status: DC
  Administered 2020-06-17 – 2020-06-18 (×2): 81 mg via ORAL
  Filled 2020-06-16 (×2): qty 1

## 2020-06-16 MED ORDER — HEPARIN SODIUM (PORCINE) 5000 UNIT/ML IJ SOLN
5000.0000 [IU] | Freq: Three times a day (TID) | INTRAMUSCULAR | Status: DC
  Administered 2020-06-16 – 2020-06-18 (×5): 5000 [IU] via SUBCUTANEOUS
  Filled 2020-06-16 (×5): qty 1

## 2020-06-16 MED ORDER — ONDANSETRON HCL 4 MG/2ML IJ SOLN: 4.0000 mg | Freq: Four times a day (QID) | INTRAMUSCULAR | Status: DC | PRN

## 2020-06-16 NOTE — ED Provider Notes (Signed)
[facility] EMERGENCY DEPARTMENT Provider Note   CSN: [id number] Arrival date & time: 06/16/20  1710     History No chief complaint on file.   [name] [name] is a 70 y.o. male.  Patient states he has been having pain down his left arm some shoulder pain, this pain is related to exertion.  Patient has history of coronary artery disease  The history is provided by the patient and medical records. No language interpreter was used.  Chest Pain Pain location:  L chest Pain quality: aching   Pain radiates to:  Does not radiate Pain severity:  Mild Onset quality:  Sudden Timing:  Intermittent Progression:  Waxing and waning Chronicity:  New Context: not breathing   Relieved by:  Nothing Associated symptoms: no abdominal pain, no back pain, no cough, no fatigue and no headache        Past Medical History:  Diagnosis Date  . Anxiety state, unspecified   . Backache, unspecified   . Coronary artery disease   . Cramp of limb   . Depressive disorder, not elsewhere classified   . Disturbance of skin sensation   . Esophageal reflux   . Gout, unspecified   . Hyperlipidemia   . Hypertension   . Hypertonicity of bladder   . Irritable bowel syndrome   . Lumbago   . Obesity, unspecified   . Old myocardial infarction   . Open wound of hand except finger(s) alone, without mention of complication   . Osteoarthrosis, unspecified whether generalized or localized, unspecified site   . Other specified visual disturbances   . Pain in joint, lower leg   . Peptic ulcer, unspecified site, unspecified as acute or chronic, without mention of hemorrhage, perforation, or obstruction   . Psychosexual dysfunction with inhibited sexual excitement   . Sinus bradycardia   . Unspecified constipation   . Unspecified hypertrophic and atrophic condition of skin   . Unspecified hypothyroidism     Patient Active Problem List   Diagnosis Date Noted  . Elevated troponin 06/16/2020  . MURMUR  12/08/2009  . CHEST PAIN-UNSPECIFIED 12/08/2009  . SINUS BRADYCARDIA 12/12/2008  . ALLERGIC RHINITIS, SEASONAL 10/13/2008  . CORONARY ATHEROSCLEROSIS NATIVE CORONARY ARTERY 04/01/2008  . GOUT NOS 01/17/2007  . BACK PAIN 01/17/2007  . OBESITY NOS 10/19/2006  . ERECTILE DYSFUNCTION 10/19/2006  . HYPOTHYROIDISM 04/09/2006  . HYPERLIPIDEMIA 04/09/2006  . ANXIETY 04/09/2006  . DEPRESSION 04/09/2006  . HYPERTENSION 04/09/2006  . ASTHMA 04/09/2006  . GERD 04/09/2006  . PUD 04/09/2006  . CONSTIPATION NOS 04/09/2006  . IBS 04/09/2006  . OVERACTIVE BLADDER 04/09/2006  . OSTEOARTHRITIS 04/09/2006  . LOW BACK PAIN 04/09/2006    Past Surgical History:  Procedure Laterality Date  . CARDIAC CATHETERIZATION  2005  . WISDOM TOOTH EXTRACTION         Family History  Problem Relation Age of Onset  . Alzheimer's disease Mother   . Hypertension Mother   . Lung cancer Father   . Hypertension Brother   . Coronary artery disease Brother   . Diabetes Brother     Social History   Tobacco Use  . Smoking status: Never Smoker  . Smokeless tobacco: Never Used  Vaping Use  . Vaping Use: Never used  Substance Use Topics  . Alcohol use: No  . Drug use: No    Home Medications Prior to Admission medications   Medication Sig Start Date End Date Taking? Authorizing Provider  aspirin EC 81 MG tablet Take 81 mg  by mouth daily. Swallow whole.   Yes [provider]  EUTHYROX 125 MCG tablet Take 125 mcg by mouth daily. 04/24/20  Yes [provider]  indomethacin (INDOCIN) 25 MG capsule Take 25 mg by mouth 2 (two) times daily.   Yes [provider]  acetaminophen-codeine (TYLENOL #3) 300-30 MG per tablet Take 1 tablet by mouth every 6 (six) hours as needed for moderate pain. Patient not taking: Reported on 06/16/2020    [provider]  levothyroxine (SYNTHROID, LEVOTHROID) 75 MCG tablet Take 75 mcg by mouth daily.   Patient not taking: Reported on 06/16/2020     [provider]    Allergies    Celecoxib, Clopidogrel bisulfate, and Ramipril  Review of Systems   Review of Systems  Constitutional: Negative for appetite change and fatigue.  HENT: Negative for congestion, ear discharge and sinus pressure.   Eyes: Negative for discharge.  Respiratory: Negative for cough.   Cardiovascular: Positive for chest pain.  Gastrointestinal: Negative for abdominal pain and diarrhea.  Genitourinary: Negative for frequency and hematuria.  Musculoskeletal: Negative for back pain.       Shoulder pain  Skin: Negative for rash.  Neurological: Negative for seizures and headaches.  Psychiatric/Behavioral: Negative for hallucinations.    Physical Exam Updated Vital Signs BP (!) 163/91   Pulse (!) 48   Temp 99 F (37.2 C) (Oral)   Resp 15   SpO2 100%   Physical Exam Vitals and nursing note reviewed.  Constitutional:      Appearance: He is well-developed.  HENT:     Head: Normocephalic.     Mouth/Throat:     Mouth: Mucous membranes are moist.  Eyes:     General: No scleral icterus.    Extraocular Movements: EOM normal.     Conjunctiva/sclera: Conjunctivae normal.  Neck:     Thyroid: No thyromegaly.  Cardiovascular:     Rate and Rhythm: Normal rate and regular rhythm.     Heart sounds: No murmur heard. No friction rub. No gallop.   Pulmonary:     Breath sounds: No stridor. No wheezing or rales.  Chest:     Chest wall: No tenderness.  Abdominal:     General: There is no distension.     Tenderness: There is no abdominal tenderness. There is no rebound.  Musculoskeletal:        General: No edema. Normal range of motion.     Cervical back: Neck supple.  Lymphadenopathy:     Cervical: No cervical adenopathy.  Skin:    Findings: No erythema or rash.  Neurological:     Mental Status: He is alert and oriented to person, place, and time.     Motor: No abnormal muscle tone.     Coordination: Coordination normal.  Psychiatric:         Mood and Affect: Mood and affect normal.        Behavior: Behavior normal.     ED Results / Procedures / Treatments   Labs (all labs ordered are listed, but only abnormal results are displayed) Labs Reviewed  CBC WITH DIFFERENTIAL/PLATELET - Abnormal; Notable for the following components:      Result Value   RBC 3.98 (*)    Hemoglobin 12.4 (*)    HCT 38.2 (*)    Neutro Abs 1.5 (*)    All other components within normal limits  TROPONIN I (HIGH SENSITIVITY) - Abnormal; Notable for the following components:   Troponin I (High Sensitivity) 111 (*)  All other components within normal limits  TROPONIN I (HIGH SENSITIVITY) - Abnormal; Notable for the following components:   Troponin I (High Sensitivity) 113 (*)    All other components within normal limits  COMPREHENSIVE METABOLIC PANEL  TSH    EKG EKG Interpretation  Date/Time:  Tuesday June 16 2020 17:31:34 EST Ventricular Rate:  53 PR Interval:    QRS Duration: 117 QT Interval:  507 QTC Calculation: 477 R Axis:   -24 Text Interpretation: Sinus rhythm Nonspecific intraventricular conduction delay Borderline repolarization abnormality Confirmed by Milton Ferguson [phone]) on 06/16/2020 5:40:35 PM Also confirmed by Milton Ferguson [phone])  on 06/16/2020 10:08:29 PM   Radiology CT Head Wo Contrast  Result Date: 06/16/2020 CLINICAL DATA:  Hervey Ard intermittent head pain with left arm pain and numbness EXAM: CT HEAD WITHOUT CONTRAST TECHNIQUE: Contiguous axial images were obtained from the base of the skull through the vertex without intravenous contrast. COMPARISON:  None. FINDINGS: Brain: No evidence of acute infarction, hemorrhage, hydrocephalus, extra-axial collection or mass lesion/mass effect. Vascular: No hyperdense vessels.  Carotid vascular calcification Skull: Normal. Negative for fracture or focal lesion. Sinuses/Orbits: No acute finding. Other: None IMPRESSION: Negative non contrasted CT appearance of the brain.  Electronically Signed   By: Donavan Foil M.D.   On: 06/16/2020 18:54   CT Cervical Spine Wo Contrast  Result Date: 06/16/2020 CLINICAL DATA:  Arm pain and numbness EXAM: CT CERVICAL SPINE WITHOUT CONTRAST TECHNIQUE: Multidetector CT imaging of the cervical spine was performed without intravenous contrast. Multiplanar CT image reconstructions were also generated. COMPARISON:  CT cervical spine 10/02/2010 FINDINGS: Alignment: Mild reversal of cervical lordosis. Facet alignment within normal limits. Skull base and vertebrae: No acute fracture. No primary bone lesion or focal pathologic process. Soft tissues and spinal canal: No prevertebral fluid or swelling. No visible canal hematoma. Disc levels: Diffuse degenerative changes throughout the cervical spine with moderate disc space narrowing and degenerative change C3-C4, C4-C5, C5-C6 C7. Facet degenerative changes at multiple levels. Upper chest: Lung apices are clear. Stable to decreased size of 1.7 cm hypodense nodule right lobe of thyroid with calcification. Other: None IMPRESSION: Mild reversal of cervical lordosis with diffuse degenerative changes. No acute osseous abnormality. Stable to slight decreased size of 1.7 cm hypodense nodule right lobe of thyroid. Stability for greater than 5 years implies benignity; no biopsy or followup indicated (ref: J Am Coll Radiol. 2015 Feb;12(2): 143-50). Electronically Signed   By: Donavan Foil M.D.   On: 06/16/2020 19:00   DG Chest Port 1 View  Result Date: 06/16/2020 CLINICAL DATA:  Left arm pain EXAM: PORTABLE CHEST 1 VIEW COMPARISON:  05/16/2018 FINDINGS: No focal airspace disease or pleural effusion. Borderline cardiomegaly. Aortic atherosclerosis. No pneumothorax. IMPRESSION: No active disease. Borderline cardiomegaly. Electronically Signed   By: Donavan Foil M.D.   On: 06/16/2020 18:52    Procedures Procedures   Medications Ordered in ED Medications - No data to display  ED Course  I have reviewed  the triage vital signs and the nursing notes.  Pertinent labs & imaging results that were available during my care of the patient were reviewed by me and considered in my medical decision making (see chart for details).    MDM Rules/Calculators/A&P                         Patient with atypical chest pain with elevated troponins.  He will be admitted to medicine for further work-up Final Clinical Impression(s) / ED  Diagnoses Final diagnoses:  Atypical chest pain    Rx / DC Orders ED Discharge Orders    None       Milton Ferguson, MD 06/17/20 1534

## 2020-06-16 NOTE — ED Notes (Signed)
Date and time results received: 06/16/20 @ 2015 (use smartphrase ".now" to insert current time)  Test: Troponin Critical Value: 111 Name of Provider Notified: Dr Roderic Palau Orders Received?  None Or Actions Taken?: None

## 2020-06-16 NOTE — ED Notes (Signed)
Date and time results received: 06/16/20 @ 2201 (use smartphrase ".now" to insert current time)  Test:Troponin Critical Value: 113 Name of Provider Notified:Dr Zammit Orders Received? None Or Actions Taken?:None

## 2020-06-17 ENCOUNTER — Observation Stay (HOSPITAL_BASED_OUTPATIENT_CLINIC_OR_DEPARTMENT_OTHER): Payer: Medicare HMO

## 2020-06-17 DIAGNOSIS — R778 Other specified abnormalities of plasma proteins: Secondary | ICD-10-CM

## 2020-06-17 DIAGNOSIS — R0789 Other chest pain: Principal | ICD-10-CM

## 2020-06-17 DIAGNOSIS — R001 Bradycardia, unspecified: Secondary | ICD-10-CM

## 2020-06-17 DIAGNOSIS — I2510 Atherosclerotic heart disease of native coronary artery without angina pectoris: Secondary | ICD-10-CM | POA: Diagnosis not present

## 2020-06-17 DIAGNOSIS — R079 Chest pain, unspecified: Secondary | ICD-10-CM

## 2020-06-17 LAB — LIPID PANEL
Cholesterol: 172 mg/dL (ref 0–200)
HDL: 40 mg/dL — ABNORMAL LOW (ref >40)
LDL Cholesterol: 114 mg/dL — ABNORMAL HIGH (ref 0–99)
Total CHOL/HDL Ratio: 4.3 RATIO
Triglycerides: 92 mg/dL (ref <150)
VLDL: 18 mg/dL (ref 0–40)

## 2020-06-17 LAB — TROPONIN I (HIGH SENSITIVITY)
Troponin I (High Sensitivity): 108 ng/L (ref <18)
Troponin I (High Sensitivity): 112 ng/L (ref <18)
Troponin I (High Sensitivity): 114 ng/L (ref <18)
Troponin I (High Sensitivity): 121 ng/L (ref <18)

## 2020-06-17 LAB — ECHOCARDIOGRAM COMPLETE
Area-P 1/2: 2.9 cm2
S' Lateral: 3.28 cm

## 2020-06-17 LAB — SARS CORONAVIRUS 2 (TAT 6-24 HRS): SARS Coronavirus 2: NEGATIVE

## 2020-06-17 MED ORDER — NITROGLYCERIN 0.4 MG SL SUBL
0.4000 mg | SUBLINGUAL_TABLET | SUBLINGUAL | Status: DC | PRN
  Administered 2020-06-17: 0.4 mg via SUBLINGUAL
  Filled 2020-06-17 (×2): qty 1

## 2020-06-17 MED ORDER — CHLORTHALIDONE 25 MG PO TABS: 12.5000 mg | ORAL_TABLET | Freq: Every day | ORAL | Status: DC

## 2020-06-17 MED ORDER — ROSUVASTATIN CALCIUM 20 MG PO TABS
20.0000 mg | ORAL_TABLET | Freq: Every day | ORAL | Status: DC
  Administered 2020-06-17: 20 mg via ORAL
  Filled 2020-06-17: qty 1

## 2020-06-17 NOTE — Care Management Obs Status (Signed)
[place] NOTIFICATION   Patient Details  Name: [name] [name] MRN: [id number] Date of Birth: July 21, 1950   Medicare Observation Status Notification Given:  Yes    Tommy Medal 06/17/2020, 3:37 PM

## 2020-06-17 NOTE — ED Notes (Signed)
Pt c/o pain in upper left arm where BP cuff was, rating 1.5.

## 2020-06-17 NOTE — H&P (Signed)
TRH H&P    Patient Demographics:    [name] [name], is a 70 y.o. male  MRN: [id number]  DOB - 01-10-1951  Admit Date - 06/16/2020  Referring MD/NP/PA: Roderic Palau  Outpatient Primary MD for the patient is Celene Squibb, MD  Patient coming from: Home  Chief complaint- Left shoulder pain, weakness   HPI:    [name] [name]  is a 71 y.o. male, with history of unspecified hypothyroidism, sinus bradycardia, peptic ulcer disease, old myocardial infarction, obesity, irritable bowel syndrome, hypertension, hyperlipidemia, GERD, and more presents to the ED with a chief complaint of left shoulder pain/weakness.  Patient reports for the last 3 days he has had pain when lifting his arm.  He reports is intermittent.  It is worse with positional changes.  It is better with rest.  Sometimes it is associated with tingling in his fingertips.  He reports the pain starts in his shoulder and then goes down to his elbow and then goes down to his fingers.  The pain in his shoulder is deep and achy.  He reports that lifting a miniature sledgehammer at home because the pain to be worse when he flexed his shoulder.  When the pain is worse it was sharp.  He reports sometimes radiation up into his left neck into the back of his head and sometimes he feels a shock across his head.  He reports no chest pain.  He reports no pain in his back or between his shoulder blades.  His symptoms have been resolved since he has been in the ER.  He denies any fever or shortness of breath.  He does report that he used to be able to go up a flight of stairs without stopping and now he gets winded every third step.  This has been going on for quite some time per his report.  He does report that he has been feeling since he had his Covid shot several months ago.  Patient has no other complaints.  Patient does not smoke, does not drink, full code. Patient is vaccinated for  Covid.  In the ER Temp 99, heart rate 48, respiratory rate 15, blood pressure 163/91, satting 100% White blood cell count 4.8, hemoglobin 12.4 Chemistry panel is mostly unremarkable Initial tropes 111 113 114 121 TSH 2.81 CT head and C-spine showed no acute osseous abnormalities.  1.7 cm stable hypodense nodule in the right thyroid Chest x-ray shows no acute disease EKG shows sinus bradycardia which he had documented before Admission requested for chest pain observation    Review of systems:    In addition to the HPI above,  No Fever-chills, No Headache, No changes with Vision or hearing, No problems swallowing food or Liquids, No Chest pain, Cough or Shortness of Breath, No Abdominal pain, No Nausea or Vomiting, bowel movements are regular, No Blood in stool or Urine, No dysuria, No new skin rashes or bruises, No new joints pains-aches,  No new weakness, tingling, numbness in any extremity, No recent weight gain or loss, No polyuria, polydypsia or polyphagia,  No significant Mental Stressors.  All other systems reviewed and are negative.    Past History of the following :    Past Medical History:  Diagnosis Date  . Anxiety state, unspecified   . Backache, unspecified   . Coronary artery disease   . Cramp of limb   . Depressive disorder, not elsewhere classified   . Disturbance of skin sensation   . Esophageal reflux   . Gout, unspecified   . Hyperlipidemia   . Hypertension   . Hypertonicity of bladder   . Irritable bowel syndrome   . Lumbago   . Obesity, unspecified   . Old myocardial infarction   . Open wound of hand except finger(s) alone, without mention of complication   . Osteoarthrosis, unspecified whether generalized or localized, unspecified site   . Other specified visual disturbances   . Pain in joint, lower leg   . Peptic ulcer, unspecified site, unspecified as acute or chronic, without mention of hemorrhage, perforation, or obstruction   .  Psychosexual dysfunction with inhibited sexual excitement   . Sinus bradycardia   . Unspecified constipation   . Unspecified hypertrophic and atrophic condition of skin   . Unspecified hypothyroidism       Past Surgical History:  Procedure Laterality Date  . CARDIAC CATHETERIZATION  2005  . WISDOM TOOTH EXTRACTION        Social History:      Social History   Tobacco Use  . Smoking status: Never Smoker  . Smokeless tobacco: Never Used  Substance Use Topics  . Alcohol use: No       Family History :     Family History  Problem Relation Age of Onset  . Alzheimer's disease Mother   . Hypertension Mother   . Lung cancer Father   . Hypertension Brother   . Coronary artery disease Brother   . Diabetes Brother       Home Medications:   Prior to Admission medications   Medication Sig Start Date End Date Taking? Authorizing Provider  aspirin EC 81 MG tablet Take 81 mg by mouth daily. Swallow whole.   Yes [provider]  EUTHYROX 125 MCG tablet Take 125 mcg by mouth daily. 04/24/20  Yes [provider]  indomethacin (INDOCIN) 25 MG capsule Take 25 mg by mouth 2 (two) times daily.   Yes [provider]  acetaminophen-codeine (TYLENOL #3) 300-30 MG per tablet Take 1 tablet by mouth every 6 (six) hours as needed for moderate pain. Patient not taking: Reported on 06/16/2020    [provider]  levothyroxine (SYNTHROID, LEVOTHROID) 75 MCG tablet Take 75 mcg by mouth daily.   Patient not taking: Reported on 06/16/2020    [provider]     Allergies:     Allergies  Allergen Reactions  . Celecoxib     REACTION: nose bleeds  . Clopidogrel Bisulfate     REACTION: Lower GI bleed  . Ramipril     REACTION: Throat swelling     Physical Exam:   Vitals  Blood pressure (!) 137/92, pulse 72, temperature 99 F (37.2 C), temperature source Oral, resp. rate (!) 22, SpO2 95 %.  1.  General: Patient lying supine in bed in no acute  distress  2. Psychiatric: Rambling and may be forgetful as he repeats several parts of the history many times, he may be just be trying to convey understanding  3. Neurologic: Cranial nerves II through XII are grossly intact, moves all 4  extremities voluntarily, no focal deficit on limited exam  4. HEENMT:  Head is atraumatic, normocephalic, pupils reactive to light, neck is supple, trachea is midline, mucous membranes moist  5. Respiratory : Lungs are clear to auscultation bilaterally without wheezes, rhonchi, rales  6. Cardiovascular : Heart rate is slow, rhythm is regular, no murmurs rubs or gallops  7. Gastrointestinal:  Abdomen is soft, obese, nontender, nondistended, no palpable masses  8. Skin:  Skin is warm dry and intact without acute lesion on limited exam  9.Musculoskeletal:  2+ peripheral edema, no acute deformities, no calf tenderness    Data Review:    CBC Recent Labs  Lab 06/16/20 1924  WBC 4.8  HGB 12.4*  HCT 38.2*  PLT 311  MCV 96.0  MCH 31.2  MCHC 32.5  RDW 12.8  LYMPHSABS 2.7  MONOABS 0.5  EOSABS 0.2  BASOSABS 0.0   ------------------------------------------------------------------------------------------------------------------  Results for orders placed or performed during the hospital encounter of 06/16/20 (from the past 48 hour(s))  CBC with Differential/Platelet     Status: Abnormal   Collection Time: 06/16/20  7:24 PM  Result Value Ref Range   WBC 4.8 4.0 - 10.5 K/uL   RBC 3.98 (L) 4.22 - 5.81 MIL/uL   Hemoglobin 12.4 (L) 13.0 - 17.0 g/dL   HCT 38.2 (L) 39.0 - 52.0 %   MCV 96.0 80.0 - 100.0 fL   MCH 31.2 26.0 - 34.0 pg   MCHC 32.5 30.0 - 36.0 g/dL   RDW 12.8 11.5 - 15.5 %   Platelets 311 150 - 400 K/uL   nRBC 0.0 0.0 - 0.2 %   Neutrophils Relative % 31 %   Neutro Abs 1.5 (L) 1.7 - 7.7 K/uL   Lymphocytes Relative 55 %   Lymphs Abs 2.7 0.7 - 4.0 K/uL   Monocytes Relative 10 %   Monocytes Absolute 0.5 0.1 - 1.0 K/uL    Eosinophils Relative 3 %   Eosinophils Absolute 0.2 0.0 - 0.5 K/uL   Basophils Relative 1 %   Basophils Absolute 0.0 0.0 - 0.1 K/uL   Immature Granulocytes 0 %   Abs Immature Granulocytes 0.01 0.00 - 0.07 K/uL    Comment: Performed at [facility], [address] [place], [place] [address]  Comprehensive metabolic panel     Status: None   Collection Time: 06/16/20  7:24 PM  Result Value Ref Range   Sodium 135 135 - 145 mmol/L   Potassium 3.8 3.5 - 5.1 mmol/L   Chloride 104 98 - 111 mmol/L   CO2 25 22 - 32 mmol/L   Glucose, Bld 97 70 - 99 mg/dL    Comment: Glucose reference range applies only to samples taken after fasting for at least 8 hours.   BUN 18 8 - 23 mg/dL   Creatinine, Ser 1.11 0.61 - 1.24 mg/dL   Calcium 8.9 8.9 - 10.3 mg/dL   Total Protein 7.1 6.5 - 8.1 g/dL   Albumin 3.7 3.5 - 5.0 g/dL   AST 24 15 - 41 U/L   ALT 20 0 - 44 U/L   Alkaline Phosphatase 43 38 - 126 U/L   Total Bilirubin 0.4 0.3 - 1.2 mg/dL   GFR, Estimated >60 >60 mL/min    Comment: (NOTE) Calculated using the CKD-EPI Creatinine Equation (2021)    Anion gap 6 5 - 15    Comment: Performed at [facility], [address], [place], [place] [address]  Troponin I (High Sensitivity)     Status: Abnormal  Collection Time: 06/16/20  7:24 PM  Result Value Ref Range   Troponin I (High Sensitivity) 111 (HH) <18 ng/L    Comment: CRITICAL RESULT CALLED TO, READ BACK BY AND VERIFIED WITH: WATLINGTON,K. RN @2015  06/16/20 BILLINGSLEY,L (NOTE) Elevated high sensitivity troponin I (hsTnI) values and significant  changes across serial measurements may suggest ACS but many other  chronic and acute conditions are known to elevate hsTnI results.  Refer to the Links section for chest pain algorithms and additional  guidance. Performed at [facility], [address] [place], [place] [address]   TSH     Status: None   Collection Time: 06/16/20  7:25 PM  Result Value Ref Range   TSH 2.810 0.350 - 4.500 uIU/mL     Comment: Performed by a 3rd Generation assay with a functional sensitivity of <=0.01 uIU/mL. Performed at [facility], [address] [place], [place] [address]   Troponin I (High Sensitivity)     Status: Abnormal   Collection Time: 06/16/20  9:08 PM  Result Value Ref Range   Troponin I (High Sensitivity) 113 (HH) <18 ng/L    Comment: CRITICAL RESULT CALLED TO, READ BACK BY AND VERIFIED WITH: PRUITT,G. RN @2200  06/16/20 BILLINGSLEY,L (NOTE) Elevated high sensitivity troponin I (hsTnI) values and significant  changes across serial measurements may suggest ACS but many other  chronic and acute conditions are known to elevate hsTnI results.  Refer to the Links section for chest pain algorithms and additional  guidance. Performed at [facility], [address], [place], [place] [address]   Lipid panel     Status: Abnormal   Collection Time: 06/17/20 12:10 AM  Result Value Ref Range   Cholesterol 172 0 - 200 mg/dL   Triglycerides 92 <150 mg/dL   HDL 40 (L) >40 mg/dL   Total CHOL/HDL Ratio 4.3 RATIO   VLDL 18 0 - 40 mg/dL   LDL Cholesterol 114 (H) 0 - 99 mg/dL    Comment:        Total Cholesterol/HDL:CHD Risk Coronary Heart Disease Risk Table                     Men   Women  1/2 Average Risk   3.4   3.3  Average Risk       5.0   4.4  2 X Average Risk   9.6   7.1  3 X Average Risk  23.4   11.0        Use the calculated Patient Ratio above and the CHD Risk Table to determine the patient's CHD Risk.        ATP III CLASSIFICATION (LDL):  <100     mg/dL   Optimal  100-129  mg/dL   Near or Above                    Optimal  130-159  mg/dL   Borderline  160-189  mg/dL   High  >190     mg/dL   Very High Performed at [facility], [address] [place],  [address]   Troponin I (High Sensitivity)     Status: Abnormal   Collection Time: 06/17/20 12:10 AM  Result Value Ref Range   Troponin I (High Sensitivity) 114 (HH) <18 ng/L    Comment: CRITICAL VALUE NOTED.  VALUE IS  CONSISTENT WITH PREVIOUSLY REPORTED AND CALLED VALUE. (NOTE) Elevated high sensitivity troponin I (hsTnI) values and significant  changes across serial measurements may  suggest ACS but many other  chronic and acute conditions are known to elevate hsTnI results.  Refer to the Links section for chest pain algorithms and additional  guidance. Performed at [facility], [address], [place], [place] [address]   Troponin I (High Sensitivity)     Status: Abnormal   Collection Time: 06/17/20  2:08 AM  Result Value Ref Range   Troponin I (High Sensitivity) 121 (HH) <18 ng/L    Comment: CRITICAL VALUE NOTED.  VALUE IS CONSISTENT WITH PREVIOUSLY REPORTED AND CALLED VALUE. (NOTE) Elevated high sensitivity troponin I (hsTnI) values and significant  changes across serial measurements may suggest ACS but many other  chronic and acute conditions are known to elevate hsTnI results.  Refer to the Links section for chest pain algorithms and additional  guidance. Performed at [facility], [address], [place], [place] [address]     Chemistries  Recent Labs  Lab 06/16/20 1924  NA 135  K 3.8  CL 104  CO2 25  GLUCOSE 97  BUN 18  CREATININE 1.11  CALCIUM 8.9  AST 24  ALT 20  ALKPHOS 43  BILITOT 0.4   ------------------------------------------------------------------------------------------------------------------  ------------------------------------------------------------------------------------------------------------------ GFR: CrCl cannot be calculated (Unknown ideal weight.). Liver Function Tests: Recent Labs  Lab 06/16/20 1924  AST 24  ALT 20  ALKPHOS 43  BILITOT 0.4  PROT 7.1  ALBUMIN 3.7   No results for input(s): LIPASE, AMYLASE in the last 168 hours. No results for input(s): AMMONIA in the last 168 hours. Coagulation Profile: No results for input(s): INR, PROTIME in the last 168 hours. Cardiac Enzymes: No results for input(s): CKTOTAL, CKMB, CKMBINDEX, TROPONINI  in the last 168 hours. BNP (last 3 results) No results for input(s): PROBNP in the last 8760 hours. HbA1C: No results for input(s): HGBA1C in the last 72 hours. CBG: No results for input(s): GLUCAP in the last 168 hours. Lipid Profile: Recent Labs    06/17/20 0010  CHOL 172  HDL 40*  LDLCALC 114*  TRIG 92  CHOLHDL 4.3   Thyroid Function Tests: Recent Labs    06/16/20 1925  TSH 2.810   Anemia Panel: No results for input(s): VITAMINB12, FOLATE, FERRITIN, TIBC, IRON, RETICCTPCT in the last 72 hours.  --------------------------------------------------------------------------------------------------------------- Urine analysis:    Component Value Date/Time   COLORURINE YELLOW 05/16/2018 1107   APPEARANCEUR CLEAR 05/16/2018 1107   LABSPEC 1.019 05/16/2018 1107   PHURINE 5.0 05/16/2018 1107   GLUCOSEU NEGATIVE 05/16/2018 1107   HGBUR MODERATE (A) 05/16/2018 1107   HGBUR negative 08/30/2007 0958   BILIRUBINUR NEGATIVE 05/16/2018 1107   KETONESUR NEGATIVE 05/16/2018 1107   PROTEINUR NEGATIVE 05/16/2018 1107   UROBILINOGEN 0.2 07/24/2011 2037   NITRITE NEGATIVE 05/16/2018 1107   LEUKOCYTESUR NEGATIVE 05/16/2018 1107      Imaging Results:    CT Head Wo Contrast  Result Date: 06/16/2020 CLINICAL DATA:  Sharp intermittent head pain with left arm pain and numbness EXAM: CT HEAD WITHOUT CONTRAST TECHNIQUE: Contiguous axial images were obtained from the base of the skull through the vertex without intravenous contrast. COMPARISON:  None. FINDINGS: Brain: No evidence of acute infarction, hemorrhage, hydrocephalus, extra-axial collection or mass lesion/mass effect. Vascular: No hyperdense vessels.  Carotid vascular calcification Skull: Normal. Negative for fracture or focal lesion. Sinuses/Orbits: No acute finding. Other: None IMPRESSION: Negative non contrasted CT appearance of the brain. Electronically Signed   By: Donavan Foil M.D.   On: 06/16/2020 18:54   CT Cervical Spine Wo  Contrast  Result  Date: 06/16/2020 CLINICAL DATA:  Arm pain and numbness EXAM: CT CERVICAL SPINE WITHOUT CONTRAST TECHNIQUE: Multidetector CT imaging of the cervical spine was performed without intravenous contrast. Multiplanar CT image reconstructions were also generated. COMPARISON:  CT cervical spine 10/02/2010 FINDINGS: Alignment: Mild reversal of cervical lordosis. Facet alignment within normal limits. Skull base and vertebrae: No acute fracture. No primary bone lesion or focal pathologic process. Soft tissues and spinal canal: No prevertebral fluid or swelling. No visible canal hematoma. Disc levels: Diffuse degenerative changes throughout the cervical spine with moderate disc space narrowing and degenerative change C3-C4, C4-C5, C5-C6 C7. Facet degenerative changes at multiple levels. Upper chest: Lung apices are clear. Stable to decreased size of 1.7 cm hypodense nodule right lobe of thyroid with calcification. Other: None IMPRESSION: Mild reversal of cervical lordosis with diffuse degenerative changes. No acute osseous abnormality. Stable to slight decreased size of 1.7 cm hypodense nodule right lobe of thyroid. Stability for greater than 5 years implies benignity; no biopsy or followup indicated (ref: J Am Coll Radiol. 2015 Feb;12(2): 143-50). Electronically Signed   By: Donavan Foil M.D.   On: 06/16/2020 19:00   DG Chest Port 1 View  Result Date: 06/16/2020 CLINICAL DATA:  Left arm pain EXAM: PORTABLE CHEST 1 VIEW COMPARISON:  05/16/2018 FINDINGS: No focal airspace disease or pleural effusion. Borderline cardiomegaly. Aortic atherosclerosis. No pneumothorax. IMPRESSION: No active disease. Borderline cardiomegaly. Electronically Signed   By: Donavan Foil M.D.   On: 06/16/2020 18:52    My personal review of EKG: Rhythm sinus bradycardia, Rate 53 /min, QTc 477 ,no Acute ST changes   Assessment & Plan:    Active Problems:   Elevated troponin   1. Elevated troponin 1. Initial troponin I 113,  repeat 114, trend 121 2. EKG shows sinus bradycardia with a rate of 53 and a QTC of 477 3. Patient has no overt chest pain 4. Monitor on telemetry, chest pain knobs 5. Get a lipid panel 2. Left shoulder pain 1. History is most consistent with a rotator cuff 2. Please benefit from outpatient Ortho follow-up 3. Thyroid disease 1. Continue thyroid medication 2. Continue monitor 4. CAD 1. Continue home aspirin medication   DVT Prophylaxis-   Heparin- SCDs   AM Labs Ordered, also please review Full Orders  Family Communication: No family at bedside  Code Status:  Full  Admission status: Observation  Time spent in minutes : [place] DO

## 2020-06-17 NOTE — Progress Notes (Signed)
*  PRELIMINARY RESULTS* Echocardiogram 2D Echocardiogram has been performed.  Leavy Cella 06/17/2020, 10:25 AM

## 2020-06-17 NOTE — Consult Note (Signed)
Cardiology Consultation:   Patient ID: [name] [name] MRN: [id number]; DOB: 16-Sep-1950  Admit date: 06/16/2020 Date of Consult: 06/17/2020  PCP:  Celene Squibb, [place]  Cardiologist: New to [facility] - Previously followed by Dr. Lattie Haw in 11/2008   Patient Profile:   [name] [name] is a 70 y.o. male with past medical history of CAD (mild to moderate CAD by cath in 2006), HTN and Hypothyroidism who is being seen today for the evaluation of elevated troponin values at the request of Dr. Posey Pronto.  History of Present Illness:   [name] [name] presented to [facility] ED on 06/16/2020 for evaluation of left arm pain for the past few days. He reports having intermittent pain and paraesthesias down his left arm for the past several weeks but symptoms intensified the day of admission. Typically would occur when moving his arms or using a hammer around the house. Says he was typically very active outside prior to winter and denies any anginal symptoms when previously doing yard work. He does climb a flight of steps routinely and says he sometimes develops shortness of breath with this and at other times does not have any symptoms. Denies any recent chest pain. No recent orthopnea or PND. He does report having pain in his lower extremities last year and underwent testing here at [facility] (ABI's in 09/2019 showed normal right ABI and left ABI reduced at 0.80 and suggestive of mild PAD. Also reports having a headache yesterday which has since resolved.   Initial labs show WBC 4.8, Hgb 12.4, platelets 311, Na+ 135, K+ 3.8 and creatinine 1.11. TSH 2.810. COVID pending. FLP shows total cholesterol of 172, triglycerides 92, HDL 40 and LDL 114. Initial HS Troponin 111 with repeat values of 113, 114 and 121. CXR with no active cardiopulmonary disease. CT Head without acute findings. CT Cervical Spine showing mild reversal of cervical lordosis with diffuse degenerative changes with no  acute findings. EKG showing sinus bradycardia, HR 53 with TWI along V4-V6 and similar to prior tracings from 2011. Repeat tracing this AM shows more pronounced TWI along the lateral leads as compared to prior tracings.    Past Medical History:  Diagnosis Date  . Anxiety state, unspecified   . Backache, unspecified   . Coronary artery disease   . Cramp of limb   . Depressive disorder, not elsewhere classified   . Disturbance of skin sensation   . Esophageal reflux   . Gout, unspecified   . Hyperlipidemia   . Hypertension   . Hypertonicity of bladder   . Irritable bowel syndrome   . Lumbago   . Obesity, unspecified   . Old myocardial infarction   . Open wound of hand except finger(s) alone, without mention of complication   . Osteoarthrosis, unspecified whether generalized or localized, unspecified site   . Other specified visual disturbances   . Pain in joint, lower leg   . Peptic ulcer, unspecified site, unspecified as acute or chronic, without mention of hemorrhage, perforation, or obstruction   . Psychosexual dysfunction with inhibited sexual excitement   . Sinus bradycardia   . Unspecified constipation   . Unspecified hypertrophic and atrophic condition of skin   . Unspecified hypothyroidism     Past Surgical History:  Procedure Laterality Date  . CARDIAC CATHETERIZATION  2005  . WISDOM TOOTH EXTRACTION       Home Medications:  Prior to Admission medications   Medication Sig Start Date End  Date Taking? Authorizing Provider  aspirin EC 81 MG tablet Take 81 mg by mouth daily. Swallow whole.   Yes [provider]  EUTHYROX 125 MCG tablet Take 125 mcg by mouth daily. 04/24/20  Yes [provider]  indomethacin (INDOCIN) 25 MG capsule Take 25 mg by mouth 2 (two) times daily.   Yes [provider]  acetaminophen-codeine (TYLENOL #3) 300-30 MG per tablet Take 1 tablet by mouth every 6 (six) hours as needed for moderate pain. Patient not taking:  Reported on 06/16/2020    [provider]  levothyroxine (SYNTHROID, LEVOTHROID) 75 MCG tablet Take 75 mcg by mouth daily.   Patient not taking: Reported on 06/16/2020    [provider]    Inpatient Medications: Scheduled Meds: . aspirin EC  81 mg Oral Daily  . heparin  5,000 Units Subcutaneous Q8H  . levothyroxine  125 mcg Oral Q0600   Continuous Infusions:  PRN Meds: acetaminophen, nitroGLYCERIN, ondansetron (ZOFRAN) IV  Allergies:    Allergies  Allergen Reactions  . Celecoxib     REACTION: nose bleeds  . Clopidogrel Bisulfate     REACTION: Lower GI bleed  . Ramipril     REACTION: Throat swelling    Social History:   Social History   Socioeconomic History  . Marital status: Single    Spouse name: Not on file  . Number of children: Not on file  . Years of education: 79  . Highest education level: Not on file  Occupational History  . Occupation: Therapist, music, Museum/gallery conservator  Tobacco Use  . Smoking status: Never Smoker  . Smokeless tobacco: Never Used  Vaping Use  . Vaping Use: Never used  Substance and Sexual Activity  . Alcohol use: No  . Drug use: No  . Sexual activity: Not on file  Other Topics Concern  . Not on file  Social History Narrative   Lives alone   Social Determinants of Health   Financial Resource Strain: Not on file  Food Insecurity: Not on file  Transportation Needs: Not on file  Physical Activity: Not on file  Stress: Not on file  Social Connections: Not on file  Intimate Partner Violence: Not on file    Family History:    Family History  Problem Relation Age of Onset  . Alzheimer's disease Mother   . Hypertension Mother   . Lung cancer Father   . Hypertension Brother   . Coronary artery disease Brother   . Diabetes Brother      ROS:  Please see the history of present illness.   All other ROS reviewed and negative.     Physical Exam/Data:   Vitals:   06/17/20 0700 06/17/20 0800 06/17/20 0830 06/17/20 0900   BP: (!) 145/87 137/75 (!) 138/101 (!) 130/100  Pulse: (!) 47 (!) 50 (!) 44 (!) 51  Resp: 12 19 15 15   Temp:      TempSrc:      SpO2: 98% 97% 96% 96%   No intake or output data in the 24 hours ending 06/17/20 1000 Last 3 Weights 01/16/2015 07/24/2011 01/04/2010  Weight (lbs) 290 lb 275 lb 259 lb  Weight (kg) 131.543 kg 124.739 kg 117.482 kg     There is no height or weight on file to calculate BMI.  General:  Well nourished, well developed male appearing in no acute distress HEENT: normal Lymph: no adenopathy Neck: no JVD Endocrine:  No thryomegaly Vascular: No carotid bruits; FA pulses 2+ bilaterally without bruits  Cardiac:  normal S1, S2; RRR; no murmur  Lungs:  clear to auscultation bilaterally, no wheezing, rhonchi or rales  Abd: soft, nontender, no hepatomegaly  Ext: no edema Musculoskeletal:  No deformities, BUE and BLE strength normal and equal Skin: warm and dry  Neuro:  CNs 2-12 intact, no focal abnormalities noted Psych:  Normal affect   EKG:  The EKG was personally reviewed and demonstrates:  Sinus bradycardia, HR 53 with TWI along V4-V6 and similar to prior tracings from 2011. Repeat tracing this AM shows more pronounced TWI along the lateral leads as compared to prior tracings.   Telemetry:  Telemetry was personally reviewed and demonstrates: Sinus bradycardia, HR in 40's to 50's. No evidence of high-grade block.   Relevant CV Studies:  Cardiac Catheterization: 10/2004 Coronaries:  The left main was normal.  The LAD had proximal tandem 50%  lesions after a large diagonal.  There was a mid 70% stenosis.  The first  diagonal was large with ostial 50% stenosis and proximal 60% stenosis.  The  circumflex and the AV groove had multiple 25% proximal lesions.  There was a  50% stenosis in the mid vessel and 50% more distal after mid obtuse  marginal.  The mid obtuse marginal was a moderate sized branching vessel and  was normal.  The right coronary artery was dominant.   There were diffuse  proximal and luminal irregularities.  There was mid 40% stenosis.  There was  a long mid 30% stenosis.  There was distal 40% stenosis.  The PDA was  moderate size and normal.  The posterolateral was small and normal.   Left ventriculogram:  The left ventriculogram was obtained in the RAO  projection.  The EF was 60%.   CONCLUSION:  Diffuse coronary artery disease.  The LAD mid lesion was  moderately high grade.  Other disease did not appear to be obstructive.  I  did review and concur this to the 2002 procedure.  There was progression,  particularly in the mid LAD and diagonal.  However, the proximal LAD and  other vessels appeared to be relatively unchanged.   Laboratory Data:  High Sensitivity Troponin:   Recent Labs  Lab 06/16/20 1924 06/16/20 2108 06/17/20 0010 06/17/20 0208  TROPONINIHS 111* 113* 114* 121*     Chemistry Recent Labs  Lab 06/16/20 1924  NA 135  K 3.8  CL 104  CO2 25  GLUCOSE 97  BUN 18  CREATININE 1.11  CALCIUM 8.9  GFRNONAA >60  ANIONGAP 6    Recent Labs  Lab 06/16/20 1924  PROT 7.1  ALBUMIN 3.7  AST 24  ALT 20  ALKPHOS 43  BILITOT 0.4   Hematology Recent Labs  Lab 06/16/20 1924  WBC 4.8  RBC 3.98*  HGB 12.4*  HCT 38.2*  MCV 96.0  MCH 31.2  MCHC 32.5  RDW 12.8  PLT 311   BNPNo results for input(s): BNP, PROBNP in the last 168 hours.  DDimer No results for input(s): DDIMER in the last 168 hours.   Radiology/Studies:  CT Head Wo Contrast  Result Date: 06/16/2020 CLINICAL DATA:  Hervey Ard intermittent head pain with left arm pain and numbness EXAM: CT HEAD WITHOUT CONTRAST TECHNIQUE: Contiguous axial images were obtained from the base of the skull through the vertex without intravenous contrast. COMPARISON:  None. FINDINGS: Brain: No evidence of acute infarction, hemorrhage, hydrocephalus, extra-axial collection or mass lesion/mass effect. Vascular: No hyperdense vessels.  Carotid vascular calcification  Skull: Normal. Negative for fracture  or focal lesion. Sinuses/Orbits: No acute finding. Other: None IMPRESSION: Negative non contrasted CT appearance of the brain. Electronically Signed   By: Donavan Foil M.D.   On: 06/16/2020 18:54   CT Cervical Spine Wo Contrast  Result Date: 06/16/2020 CLINICAL DATA:  Arm pain and numbness EXAM: CT CERVICAL SPINE WITHOUT CONTRAST TECHNIQUE: Multidetector CT imaging of the cervical spine was performed without intravenous contrast. Multiplanar CT image reconstructions were also generated. COMPARISON:  CT cervical spine 10/02/2010 FINDINGS: Alignment: Mild reversal of cervical lordosis. Facet alignment within normal limits. Skull base and vertebrae: No acute fracture. No primary bone lesion or focal pathologic process. Soft tissues and spinal canal: No prevertebral fluid or swelling. No visible canal hematoma. Disc levels: Diffuse degenerative changes throughout the cervical spine with moderate disc space narrowing and degenerative change C3-C4, C4-C5, C5-C6 C7. Facet degenerative changes at multiple levels. Upper chest: Lung apices are clear. Stable to decreased size of 1.7 cm hypodense nodule right lobe of thyroid with calcification. Other: None IMPRESSION: Mild reversal of cervical lordosis with diffuse degenerative changes. No acute osseous abnormality. Stable to slight decreased size of 1.7 cm hypodense nodule right lobe of thyroid. Stability for greater than 5 years implies benignity; no biopsy or followup indicated (ref: J Am Coll Radiol. 2015 Feb;12(2): 143-50). Electronically Signed   By: Donavan Foil M.D.   On: 06/16/2020 19:00   DG Chest Port 1 View  Result Date: 06/16/2020 CLINICAL DATA:  Left arm pain EXAM: PORTABLE CHEST 1 VIEW COMPARISON:  05/16/2018 FINDINGS: No focal airspace disease or pleural effusion. Borderline cardiomegaly. Aortic atherosclerosis. No pneumothorax. IMPRESSION: No active disease. Borderline cardiomegaly. Electronically Signed   By: Donavan Foil M.D.   On: 06/16/2020 18:52     Assessment and Plan:   1. Elevated Troponin Values/Abnormal EKG - He presents with left shoulder pain which sounds most consistent with MSK pain/cervcial radiculopathy as it occurs with movement of his arm. Denies any chest pain but does have intermittent dyspnea on exertion. - Initial HS Troponin 111 with repeat values of 113, 114 and 121. EKG shows sinus bradycardia, HR 53 with TWI along V4-V6 and similar to prior tracings from 2011. Repeat tracing this AM shows more pronounced TWI along the lateral leads as compared to prior tracings.  - Will plan for an echocardiogram to assess LV function and wall motion. If no significant abnormalities, would plan for a Lexiscan Myoview tomorrow morning and he will need to be NPO after midnight. If EF reduced, would plan for a repeat cardiac catheterization for definitive evaluation. Await COVID results as well (currently pending).   2. CAD - He had mild to moderate CAD by prior catheterization in 2006 as outlined above. Will plan for repeat ischemic evaluation pending echo results.  - Continue ASA 81mg  daily. Will start Crestor 20mg  daily. No BB given baseline bradycardia.   3. Bradycardia - HR has been in the 40's to 50's by review of telemetry. No high-grade AV block or significant pauses thus far by review of telemetry. Continue to avoid AV nodal blocking agents.    For questions or updates, please contact [facility] Please consult [email or web address] for contact info under    Signed, Erma Heritage, PA-C  06/17/2020 10:00 AM

## 2020-06-17 NOTE — ED Notes (Signed)
US at bedside for echo.

## 2020-06-17 NOTE — Progress Notes (Signed)
[facility] OF CARE NOTE Patient: [name] [name] KRC:[id number]   PCP: Celene Squibb, [place] DOB: [birth year]   DOA: 06/16/2020   DOS: 06/17/2020    Patient was admitted by my colleague earlier on 06/17/2020. I have reviewed the H&P as well as assessment and plan and agree with the same. Important changes in the plan are listed below.  Plan of care: Active Problems:   Elevated troponin Abnormal EKG on 2/23. Patient had some chest pain which resolved with nitroglycerin. Symptoms are more likely to be shoulder musculoskeletal leg pain. But given his EKG changes as well as troponins cardiology was consulted. Highly appreciate their assistance. Patient will be undergoing stress test.  Author: Berle Mull, MD [facility] 06/17/2020 4:06 PM   If 7PM-7AM, please contact night-coverage at [email or web address]

## 2020-06-17 NOTE — ED Notes (Signed)
ED TO INPATIENT HANDOFF REPORT  ED Nurse Name and Phone #: [phone]  S Name/Age/Gender [name] [name] 70 y.o. male Room/Bed: APA02/APA02  Code Status   Code Status: Full Code  Home/SNF/Other Home Patient oriented to: self, place, time and situation Is this baseline? Yes   Triage Complete: Triage complete  Chief Complaint Elevated troponin [R77.8]  Triage Note No notes on file   Allergies Allergies  Allergen Reactions  . Celecoxib     REACTION: nose bleeds  . Clopidogrel Bisulfate     REACTION: Lower GI bleed  . Ramipril     REACTION: Throat swelling    Level of Care/Admitting Diagnosis ED Disposition    ED Disposition Condition [facility]: [facility] [[id number]]  Level of Care: Telemetry [5]  Covid Evaluation: Asymptomatic Screening Protocol (No Symptoms)  Diagnosis: Elevated troponin [664403]  Admitting Physician: Rolla Plate [[id number]]  Attending Physician: Rolla Plate [[id number]]       B Medical/Surgery History Past Medical History:  Diagnosis Date  . Anxiety state, unspecified   . Backache, unspecified   . Coronary artery disease   . Cramp of limb   . Depressive disorder, not elsewhere classified   . Disturbance of skin sensation   . Esophageal reflux   . Gout, unspecified   . Hyperlipidemia   . Hypertension   . Hypertonicity of bladder   . Irritable bowel syndrome   . Lumbago   . Obesity, unspecified   . Old myocardial infarction   . Open wound of hand except finger(s) alone, without mention of complication   . Osteoarthrosis, unspecified whether generalized or localized, unspecified site   . Other specified visual disturbances   . Pain in joint, lower leg   . Peptic ulcer, unspecified site, unspecified as acute or chronic, without mention of hemorrhage, perforation, or obstruction   . Psychosexual dysfunction with inhibited sexual excitement   . Sinus bradycardia   . Unspecified constipation    . Unspecified hypertrophic and atrophic condition of skin   . Unspecified hypothyroidism    Past Surgical History:  Procedure Laterality Date  . CARDIAC CATHETERIZATION  2005  . WISDOM TOOTH EXTRACTION       A IV Location/Drains/Wounds Patient Lines/Drains/Airways Status    Active Line/Drains/Airways    Name Placement date Placement time Site Days   Peripheral IV 06/16/20 Right Antecubital 06/16/20  2249  Antecubital  1          Intake/Output Last 24 hours No intake or output data in the 24 hours ending 06/17/20 0847  Labs/Imaging Results for orders placed or performed during the hospital encounter of 06/16/20 (from the past 48 hour(s))  CBC with Differential/Platelet     Status: Abnormal   Collection Time: 06/16/20  7:24 PM  Result Value Ref Range   WBC 4.8 4.0 - 10.5 K/uL   RBC 3.98 (L) 4.22 - 5.81 MIL/uL   Hemoglobin 12.4 (L) 13.0 - 17.0 g/dL   HCT 38.2 (L) 39.0 - 52.0 %   MCV 96.0 80.0 - 100.0 fL   MCH 31.2 26.0 - 34.0 pg   MCHC 32.5 30.0 - 36.0 g/dL   RDW 12.8 11.5 - 15.5 %   Platelets 311 150 - 400 K/uL   nRBC 0.0 0.0 - 0.2 %   Neutrophils Relative % 31 %   Neutro Abs 1.5 (L) 1.7 - 7.7 K/uL   Lymphocytes Relative 55 %   Lymphs Abs 2.7 0.7 - 4.0 K/uL  Monocytes Relative 10 %   Monocytes Absolute 0.5 0.1 - 1.0 K/uL   Eosinophils Relative 3 %   Eosinophils Absolute 0.2 0.0 - 0.5 K/uL   Basophils Relative 1 %   Basophils Absolute 0.0 0.0 - 0.1 K/uL   Immature Granulocytes 0 %   Abs Immature Granulocytes 0.01 0.00 - 0.07 K/uL    Comment: Performed at [facility], [address], [place], [place] [address]  Comprehensive metabolic panel     Status: None   Collection Time: 06/16/20  7:24 PM  Result Value Ref Range   Sodium 135 135 - 145 mmol/L   Potassium 3.8 3.5 - 5.1 mmol/L   Chloride 104 98 - 111 mmol/L   CO2 25 22 - 32 mmol/L   Glucose, Bld 97 70 - 99 mg/dL    Comment: Glucose reference range applies only to samples taken after fasting for at least 8  hours.   BUN 18 8 - 23 mg/dL   Creatinine, Ser 1.11 0.61 - 1.24 mg/dL   Calcium 8.9 8.9 - 10.3 mg/dL   Total Protein 7.1 6.5 - 8.1 g/dL   Albumin 3.7 3.5 - 5.0 g/dL   AST 24 15 - 41 U/L   ALT 20 0 - 44 U/L   Alkaline Phosphatase 43 38 - 126 U/L   Total Bilirubin 0.4 0.3 - 1.2 mg/dL   GFR, Estimated >60 >60 mL/min    Comment: (NOTE) Calculated using the CKD-EPI Creatinine Equation (2021)    Anion gap 6 5 - 15    Comment: Performed at [facility], [address] [place], [place] [address]  Troponin I (High Sensitivity)     Status: Abnormal   Collection Time: 06/16/20  7:24 PM  Result Value Ref Range   Troponin I (High Sensitivity) 111 (HH) <18 ng/L    Comment: CRITICAL RESULT CALLED TO, READ BACK BY AND VERIFIED WITH: WATLINGTON,K. RN @2015  06/16/20 BILLINGSLEY,L (NOTE) Elevated high sensitivity troponin I (hsTnI) values and significant  changes across serial measurements may suggest ACS but many other  chronic and acute conditions are known to elevate hsTnI results.  Refer to the Links section for chest pain algorithms and additional  guidance. Performed at [facility], [address] [place], [place] [address]   TSH     Status: None   Collection Time: 06/16/20  7:25 PM  Result Value Ref Range   TSH 2.810 0.350 - 4.500 uIU/mL    Comment: Performed by a 3rd Generation assay with a functional sensitivity of <=0.01 uIU/mL. Performed at [facility], [address] [place], [place] [address]   Troponin I (High Sensitivity)     Status: Abnormal   Collection Time: 06/16/20  9:08 PM  Result Value Ref Range   Troponin I (High Sensitivity) 113 (HH) <18 ng/L    Comment: CRITICAL RESULT CALLED TO, READ BACK BY AND VERIFIED WITH: PRUITT,G. RN @2200  06/16/20 BILLINGSLEY,L (NOTE) Elevated high sensitivity troponin I (hsTnI) values and significant  changes across serial measurements may suggest ACS but many other  chronic and acute conditions are known to elevate hsTnI results.  Refer  to the Links section for chest pain algorithms and additional  guidance. Performed at [facility], [address], [place], [place] [address]   Lipid panel     Status: Abnormal   Collection Time: 06/17/20 12:10 AM  Result Value Ref Range   Cholesterol 172 0 - 200 mg/dL   Triglycerides 92 <150 mg/dL   HDL 40 (L) >40 mg/dL  Total CHOL/HDL Ratio 4.3 RATIO   VLDL 18 0 - 40 mg/dL   LDL Cholesterol 114 (H) 0 - 99 mg/dL    Comment:        Total Cholesterol/HDL:CHD Risk Coronary Heart Disease Risk Table                     Men   Women  1/2 Average Risk   3.4   3.3  Average Risk       5.0   4.4  2 X Average Risk   9.6   7.1  3 X Average Risk  23.4   11.0        Use the calculated Patient Ratio above and the CHD Risk Table to determine the patient's CHD Risk.        ATP III CLASSIFICATION (LDL):  <100     mg/dL   Optimal  100-129  mg/dL   Near or Above                    Optimal  130-159  mg/dL   Borderline  160-189  mg/dL   High  >190     mg/dL   Very High Performed at [facility], [address] [place], [place] [address]   Troponin I (High Sensitivity)     Status: Abnormal   Collection Time: 06/17/20 12:10 AM  Result Value Ref Range   Troponin I (High Sensitivity) 114 (HH) <18 ng/L    Comment: CRITICAL VALUE NOTED.  VALUE IS CONSISTENT WITH PREVIOUSLY REPORTED AND CALLED VALUE. (NOTE) Elevated high sensitivity troponin I (hsTnI) values and significant  changes across serial measurements may suggest ACS but many other  chronic and acute conditions are known to elevate hsTnI results.  Refer to the Links section for chest pain algorithms and additional  guidance. Performed at [facility], [address], [place], [place] [address]   Troponin I (High Sensitivity)     Status: Abnormal   Collection Time: 06/17/20  2:08 AM  Result Value Ref Range   Troponin I (High Sensitivity) 121 (HH) <18 ng/L    Comment: CRITICAL VALUE NOTED.  VALUE IS CONSISTENT WITH PREVIOUSLY REPORTED  AND CALLED VALUE. (NOTE) Elevated high sensitivity troponin I (hsTnI) values and significant  changes across serial measurements may suggest ACS but many other  chronic and acute conditions are known to elevate hsTnI results.  Refer to the Links section for chest pain algorithms and additional  guidance. Performed at [facility], [address], [place],  [address]    CT Head Wo Contrast  Result Date: 06/16/2020 CLINICAL DATA:  Hervey Ard intermittent head pain with left arm pain and numbness EXAM: CT HEAD WITHOUT CONTRAST TECHNIQUE: Contiguous axial images were obtained from the base of the skull through the vertex without intravenous contrast. COMPARISON:  None. FINDINGS: Brain: No evidence of acute infarction, hemorrhage, hydrocephalus, extra-axial collection or mass lesion/mass effect. Vascular: No hyperdense vessels.  Carotid vascular calcification Skull: Normal. Negative for fracture or focal lesion. Sinuses/Orbits: No acute finding. Other: None IMPRESSION: Negative non contrasted CT appearance of the brain. Electronically Signed   By: Donavan Foil M.D.   On: 06/16/2020 18:54   CT Cervical Spine Wo Contrast  Result Date: 06/16/2020 CLINICAL DATA:  Arm pain and numbness EXAM: CT CERVICAL SPINE WITHOUT CONTRAST TECHNIQUE: Multidetector CT imaging of the cervical spine was performed without intravenous contrast. Multiplanar CT image reconstructions were also generated. COMPARISON:  CT cervical spine 10/02/2010 FINDINGS: Alignment:  Mild reversal of cervical lordosis. Facet alignment within normal limits. Skull base and vertebrae: No acute fracture. No primary bone lesion or focal pathologic process. Soft tissues and spinal canal: No prevertebral fluid or swelling. No visible canal hematoma. Disc levels: Diffuse degenerative changes throughout the cervical spine with moderate disc space narrowing and degenerative change C3-C4, C4-C5, C5-C6 C7. Facet degenerative changes at multiple levels. Upper  chest: Lung apices are clear. Stable to decreased size of 1.7 cm hypodense nodule right lobe of thyroid with calcification. Other: None IMPRESSION: Mild reversal of cervical lordosis with diffuse degenerative changes. No acute osseous abnormality. Stable to slight decreased size of 1.7 cm hypodense nodule right lobe of thyroid. Stability for greater than 5 years implies benignity; no biopsy or followup indicated (ref: J Am Coll Radiol. 2015 Feb;12(2): 143-50). Electronically Signed   By: Donavan Foil M.D.   On: 06/16/2020 19:00   DG Chest Port 1 View  Result Date: 06/16/2020 CLINICAL DATA:  Left arm pain EXAM: PORTABLE CHEST 1 VIEW COMPARISON:  05/16/2018 FINDINGS: No focal airspace disease or pleural effusion. Borderline cardiomegaly. Aortic atherosclerosis. No pneumothorax. IMPRESSION: No active disease. Borderline cardiomegaly. Electronically Signed   By: Donavan Foil M.D.   On: 06/16/2020 18:52    Pending Labs Unresulted Labs (From admission, onward)          Start     Ordered   06/16/20 2312  SARS CORONAVIRUS 2 (TAT 6-24 HRS) Nasopharyngeal Nasopharyngeal Swab  (Tier 3 - Symptomatic/asymptomatic with Precautions)  Once,   STAT       Question Answer Comment  Is this test for diagnosis or screening Screening   Symptomatic for COVID-19 as defined by CDC No   Hospitalized for COVID-19 No   Admitted to ICU for COVID-19 No   Previously tested for COVID-19 No   Resident in a congregate (group) care setting No   Employed in healthcare setting No   Has patient completed COVID vaccination(s) (2 doses of Pfizer/Moderna 1 dose of [employer]) Yes   Has patient completed COVID Booster / 3rd dose Yes      06/16/20 2314          Vitals/Pain Today's Vitals   06/17/20 0400 06/17/20 0700 06/17/20 0703 06/17/20 0800  BP: (!) 137/92 (!) 145/87  137/75  Pulse: 72 (!) 47  (!) 50  Resp: (!) 22 12  19   Temp:      TempSrc:      SpO2: 95% 98%  97%  PainSc:   0-No pain     Isolation  Precautions Airborne and Contact precautions  Medications Medications  aspirin EC tablet 81 mg (has no administration in time range)  acetaminophen (TYLENOL) tablet 650 mg (has no administration in time range)  ondansetron (ZOFRAN) injection 4 mg (has no administration in time range)  heparin injection 5,000 Units (5,000 Units Subcutaneous Given 06/17/20 0609)  levothyroxine (SYNTHROID) tablet 125 mcg (125 mcg Oral Given 06/17/20 5809)    Mobility walks Low fall risk   Focused Assessments    R Recommendations: See Admitting Provider Note  Report given to:   Additional Notes:

## 2020-06-17 NOTE — ED Notes (Signed)
Pt left arm pain resolved after 1 nitro. Dr Posey Pronto notified.

## 2020-06-17 NOTE — ED Notes (Signed)
Dr Rogene Houston given EKG. Dr Posey Pronto notified of pt complaints.

## 2020-06-18 ENCOUNTER — Encounter (HOSPITAL_COMMUNITY): Payer: Self-pay | Admitting: Family Medicine

## 2020-06-18 ENCOUNTER — Observation Stay (HOSPITAL_BASED_OUTPATIENT_CLINIC_OR_DEPARTMENT_OTHER): Payer: Medicare HMO

## 2020-06-18 DIAGNOSIS — R0609 Other forms of dyspnea: Secondary | ICD-10-CM | POA: Diagnosis not present

## 2020-06-18 DIAGNOSIS — R001 Bradycardia, unspecified: Secondary | ICD-10-CM | POA: Diagnosis not present

## 2020-06-18 DIAGNOSIS — R0789 Other chest pain: Secondary | ICD-10-CM | POA: Diagnosis not present

## 2020-06-18 DIAGNOSIS — R778 Other specified abnormalities of plasma proteins: Secondary | ICD-10-CM | POA: Diagnosis not present

## 2020-06-18 DIAGNOSIS — I2510 Atherosclerotic heart disease of native coronary artery without angina pectoris: Secondary | ICD-10-CM | POA: Diagnosis not present

## 2020-06-18 LAB — CBC
HCT: 40.5 % (ref 39.0–52.0)
Hemoglobin: 12.7 g/dL — ABNORMAL LOW (ref 13.0–17.0)
MCH: 30 pg (ref 26.0–34.0)
MCHC: 31.4 g/dL (ref 30.0–36.0)
MCV: 95.5 fL (ref 80.0–100.0)
Platelets: 323 10*3/uL (ref 150–400)
RBC: 4.24 MIL/uL (ref 4.22–5.81)
RDW: 12.9 % (ref 11.5–15.5)
WBC: 4.8 10*3/uL (ref 4.0–10.5)
nRBC: 0 % (ref 0.0–0.2)

## 2020-06-18 LAB — NM MYOCAR MULTI W/SPECT W/WALL MOTION / EF
LV dias vol: 156 mL (ref 62–150)
LV sys vol: 75 mL
Peak HR: 75 {beats}/min
RATE: 0.37
Rest HR: 45 {beats}/min
SDS: 0
SRS: 1
SSS: 1
TID: 1.3

## 2020-06-18 LAB — BASIC METABOLIC PANEL
Anion gap: 7 (ref 5–15)
BUN: 18 mg/dL (ref 8–23)
CO2: 28 mmol/L (ref 22–32)
Calcium: 9.4 mg/dL (ref 8.9–10.3)
Chloride: 103 mmol/L (ref 98–111)
Creatinine, Ser: 1.12 mg/dL (ref 0.61–1.24)
GFR, Estimated: >60 mL/min (ref >60)
Glucose, Bld: 95 mg/dL (ref 70–99)
Potassium: 4 mmol/L (ref 3.5–5.1)
Sodium: 138 mmol/L (ref 135–145)

## 2020-06-18 LAB — MAGNESIUM: Magnesium: 2.2 mg/dL (ref 1.7–2.4)

## 2020-06-18 MED ORDER — ROSUVASTATIN CALCIUM 20 MG PO TABS: 20.0000 mg | ORAL_TABLET | Freq: Every day | ORAL | 0 refills | Status: AC

## 2020-06-18 MED ORDER — TECHNETIUM TC 99M TETROFOSMIN IV KIT
30.0000 | PACK | Freq: Once | INTRAVENOUS | Status: AC | PRN
  Administered 2020-06-18: 31 via INTRAVENOUS

## 2020-06-18 MED ORDER — REGADENOSON 0.4 MG/5ML IV SOLN
INTRAVENOUS | Status: AC
  Administered 2020-06-18: 0.4 mg via INTRAVENOUS
  Filled 2020-06-18: qty 5

## 2020-06-18 MED ORDER — TECHNETIUM TC 99M TETROFOSMIN IV KIT
10.0000 | PACK | Freq: Once | INTRAVENOUS | Status: AC | PRN
  Administered 2020-06-18: 11 via INTRAVENOUS

## 2020-06-18 MED ORDER — SODIUM CHLORIDE FLUSH 0.9 % IV SOLN
INTRAVENOUS | Status: AC
  Administered 2020-06-18: 10 mL via INTRAVENOUS
  Filled 2020-06-18: qty 10

## 2020-06-18 NOTE — Progress Notes (Signed)
Has denied chest pain today.  To follow up with cardiology outpatient.  IV removed and discharge instructions reviewed, scripts sent to pharmacy.  To go by [facility] to ED entrance where patient parked.

## 2020-06-18 NOTE — Progress Notes (Signed)
Progress Note  Patient Name: [name] [name] Date of Encounter: 06/18/2020  Primary Cardiologist: New to [facility] this admission  Subjective   He denies any pain overnight. Has been NPO since midnight for stress testing today.   Inpatient Medications    Scheduled Meds: . aspirin EC  81 mg Oral Daily  . heparin  5,000 Units Subcutaneous Q8H  . levothyroxine  125 mcg Oral Q0600  . rosuvastatin  20 mg Oral q1800   Continuous Infusions:  PRN Meds: acetaminophen, nitroGLYCERIN, ondansetron (ZOFRAN) IV, technetium tetrofosmin   Vital Signs    Vitals:   06/17/20 1411 06/17/20 1603 06/17/20 2104 06/18/20 0440  BP: (!) 160/93 (!) 149/82 (!) 148/95 128/83  Pulse: (!) 47 (!) 45 (!) 51 (!) 58  Resp: 18 20 20 20   Temp: 98.5 F (36.9 C) 98.9 F (37.2 C) 98.8 F (37.1 C) 98.3 F (36.8 C)  TempSrc: Oral Oral Oral Oral  SpO2: 100% 100% 98% 96%  Weight:      Height:       No intake or output data in the 24 hours ending 06/18/20 0805  Last 3 Weights 06/17/2020 01/16/2015 07/24/2011  Weight (lbs) 280 lb 13.9 oz 290 lb 275 lb  Weight (kg) 127.4 kg 131.543 kg 124.739 kg      Telemetry    Sinus bradycardia, HR in 40's to 50's. No significant pauses or evidence of high-grade block.  - Personally Reviewed  ECG    Sinus bradycardia, HR 51 with TWI along the inferior and lateral leads.  - Personally Reviewed  Physical Exam   General: Well developed, well nourished, male appearing in no acute distress. Head: Normocephalic, atraumatic.  Neck: Supple without bruits, JVD not elevated. Lungs:  Resp regular and unlabored, CTA without wheezing or rales. Heart: RRR, S1, S2, no S3, S4, or murmur; no rub. Abdomen: Soft, non-tender. No hepatomegaly. No rebound/guarding. No obvious abdominal masses. Extremities: No clubbing, cyanosis, or edema. Distal pedal pulses are 2+ bilaterally. Neuro: Alert and oriented X 3. Moves all extremities spontaneously. Psych: Normal affect.  Labs     Chemistry Recent Labs  Lab 06/16/20 1924 06/18/20 0406  NA 135 138  K 3.8 4.0  CL 104 103  CO2 25 28  GLUCOSE 97 95  BUN 18 18  CREATININE 1.11 1.12  CALCIUM 8.9 9.4  PROT 7.1  --   ALBUMIN 3.7  --   AST 24  --   ALT 20  --   ALKPHOS 43  --   BILITOT 0.4  --   GFRNONAA >60 >60  ANIONGAP 6 7     Hematology Recent Labs  Lab 06/16/20 1924 06/18/20 0406  WBC 4.8 4.8  RBC 3.98* 4.24  HGB 12.4* 12.7*  HCT 38.2* 40.5  MCV 96.0 95.5  MCH 31.2 30.0  MCHC 32.5 31.4  RDW 12.8 12.9  PLT 311 323    Cardiac EnzymesNo results for input(s): TROPONINI in the last 168 hours. No results for input(s): TROPIPOC in the last 168 hours.   BNPNo results for input(s): BNP, PROBNP in the last 168 hours.   DDimer No results for input(s): DDIMER in the last 168 hours.   Radiology    CT Head Wo Contrast  Result Date: 06/16/2020 CLINICAL DATA:  Hervey Ard intermittent head pain with left arm pain and numbness EXAM: CT HEAD WITHOUT CONTRAST TECHNIQUE: Contiguous axial images were obtained from the base of the skull through the vertex without intravenous contrast. COMPARISON:  None. FINDINGS: Brain: No  evidence of acute infarction, hemorrhage, hydrocephalus, extra-axial collection or mass lesion/mass effect. Vascular: No hyperdense vessels.  Carotid vascular calcification Skull: Normal. Negative for fracture or focal lesion. Sinuses/Orbits: No acute finding. Other: None IMPRESSION: Negative non contrasted CT appearance of the brain. Electronically Signed   By: Donavan Foil M.D.   On: 06/16/2020 18:54   CT Cervical Spine Wo Contrast  Result Date: 06/16/2020 CLINICAL DATA:  Arm pain and numbness EXAM: CT CERVICAL SPINE WITHOUT CONTRAST TECHNIQUE: Multidetector CT imaging of the cervical spine was performed without intravenous contrast. Multiplanar CT image reconstructions were also generated. COMPARISON:  CT cervical spine 10/02/2010 FINDINGS: Alignment: Mild reversal of cervical lordosis. Facet  alignment within normal limits. Skull base and vertebrae: No acute fracture. No primary bone lesion or focal pathologic process. Soft tissues and spinal canal: No prevertebral fluid or swelling. No visible canal hematoma. Disc levels: Diffuse degenerative changes throughout the cervical spine with moderate disc space narrowing and degenerative change C3-C4, C4-C5, C5-C6 C7. Facet degenerative changes at multiple levels. Upper chest: Lung apices are clear. Stable to decreased size of 1.7 cm hypodense nodule right lobe of thyroid with calcification. Other: None IMPRESSION: Mild reversal of cervical lordosis with diffuse degenerative changes. No acute osseous abnormality. Stable to slight decreased size of 1.7 cm hypodense nodule right lobe of thyroid. Stability for greater than 5 years implies benignity; no biopsy or followup indicated (ref: J Am Coll Radiol. 2015 Feb;12(2): 143-50). Electronically Signed   By: Donavan Foil M.D.   On: 06/16/2020 19:00   DG Chest Port 1 View  Result Date: 06/16/2020 CLINICAL DATA:  Left arm pain EXAM: PORTABLE CHEST 1 VIEW COMPARISON:  05/16/2018 FINDINGS: No focal airspace disease or pleural effusion. Borderline cardiomegaly. Aortic atherosclerosis. No pneumothorax. IMPRESSION: No active disease. Borderline cardiomegaly. Electronically Signed   By: Donavan Foil M.D.   On: 06/16/2020 18:52     Cardiac Studies   Echocardiogram: 06/17/2020 IMPRESSIONS    1. Left ventricular ejection fraction, by estimation, is 55 to 60%. The  left ventricle has normal function. The left ventricle has no regional  wall motion abnormalities. There is mild left ventricular hypertrophy.  Left ventricular diastolic parameters  are indeterminate.  2. Right ventricular systolic function is low normal. The right  ventricular size is normal. Tricuspid regurgitation signal is inadequate  for assessing PA pressure.  3. The mitral valve is grossly normal. No evidence of mitral valve   regurgitation.  4. The aortic valve is tricuspid. Aortic valve regurgitation is not  visualized. Mild aortic valve sclerosis is present, with no evidence of  aortic valve stenosis.  5. The inferior vena cava is normal in size with greater than 50%  respiratory variability, suggesting right atrial pressure of 3 mmHg.   Patient Profile     70 y.o. male with past medical history of CAD (mild to moderate CAD by cath in 2006), HTN and Hypothyroidism who is currently admitted for left shoulder pain felt to be of MSK etiology but he did have an abnormal EKG and elevated troponin values.   Assessment & Plan    1. Elevated Troponin Values/Abnormal EKG - Reported several days of left arm pain on admission which seems most consistent with MSK pain as this occurred with movement of his arm and associated paraesthesias. No recent chest pain but he did report intermittent dyspnea on exertion when climbing steps which has occurred for several years. - Initial HS Troponin was at 111 with repeat values of 113, 114 and  121. EKG shows diffuse TWI along the inferior and lateral leads but is similar to prior tracings from 2011. Echo performed this admission shows a preserved EF of 55-60% with no regional WMA. He did have mild LVH and low-normal RV function.  - Underwent Lexiscan Myoview this AM with stress imaging pending at this time. If no significant ischemia, would anticipate discharge today with outpatient Cardiology follow-up. If abnormal, would need to arrange transfer to [facility] and pursue a cardiac catheterization for definitive evaluation.   2. CAD - Prior cath in 2006 showed mild to moderate CAD with no repeat ischemic evaluation in the interim. Stress test results are pending as outlined above.  - Continue ASA and he has been started on Crestor 20mg  daily (LDL 114 this admission).   3. Bradycardia - Baseline HR in the 40's to 50's and no evidence of high-grade block. Continue to avoid AV nodal  blocking agents.   4. HTN - BP was elevated to 163/80 yesterday and improved to 128/83 this AM. Not on medical therapy for HTN prior to admission. Chlorthalidone 12.5mg  daily was ordered by Cardiology yesterday but later discontinued by the Hospitalist. Will continue to follow readings.   For questions or updates, please contact [place] Please consult [email or web address] for contact info under Cardiology/STEMI.   Signed, Erma Heritage , PA-C 8:05 AM 06/18/2020 Pager: [phone]

## 2020-06-21 NOTE — Discharge Summary (Signed)
[facility] Discharge Summary   Patient: [name] [name]:[id number]  PCP: Celene Squibb, MD  Date of admission: 06/16/2020   Date of discharge: 06/18/2020     Discharge Diagnoses:  Principal diagnosis Musculoskeletal chest pain.  Active Problems:   Elevated troponin   Admitted From: Home Disposition:  Home   Recommendations for Outpatient Follow-up:  1. PCP: Follow-up with PCP in 1 week. 2. Follow up LABS/TEST: Sleep study   Follow-up Information    Celene Squibb, MD Follow up in 1 week(s).   Specialty: Internal Medicine Contact information: [place] [address] [phone]        Barrett, Evelene Croon, PA-C Follow up on 06/29/2020.   Specialties: Cardiology, Radiology Why: Cardiology Hospital Follow-up on 06/29/2020 at 3:30 PM. Please call the office if needing a different date or time.  Contact information: [place] [address] [phone]              Discharge Instructions    Diet - low sodium heart healthy   Complete by: As directed    Increase activity slowly   Complete by: As directed       Diet recommendation: Cardiac diet  Activity: The patient is advised to gradually reintroduce usual activities, as tolerated  Discharge Condition: stable  Code Status: Full code   History of present illness: As per the H and P dictated on admission, "[name] [name]  is a 70 y.o. male, with history of unspecified hypothyroidism, sinus bradycardia, peptic ulcer disease, old myocardial infarction, obesity, irritable bowel syndrome, hypertension, hyperlipidemia, GERD, and more presents to the ED with a chief complaint of left shoulder pain/weakness.  Patient reports for the last 3 days he has had pain when lifting his arm.  He reports is intermittent.  It is worse with positional changes.  It is better with rest.  Sometimes it is associated with tingling in his fingertips.  He reports the pain starts in his shoulder and then goes  down to his elbow and then goes down to his fingers.  The pain in his shoulder is deep and achy.  He reports that lifting a miniature sledgehammer at home because the pain to be worse when he flexed his shoulder.  When the pain is worse it was sharp.  He reports sometimes radiation up into his left neck into the back of his head and sometimes he feels a shock across his head.  He reports no chest pain.  He reports no pain in his back or between his shoulder blades.  His symptoms have been resolved since he has been in the ER.  He denies any fever or shortness of breath.  He does report that he used to be able to go up a flight of stairs without stopping and now he gets winded every third step.  This has been going on for quite some time per his report.  He does report that he has been feeling since he had his Covid shot several months ago.  Patient has no other complaints."  Hospital Course:  Summary of his active problems in the hospital is as following.  Musculoskeletal pain in left arm and shoulder Elevated troponin Abnormal EKG on 2/23. Patient had some chest pain which resolved with nitroglycerin. Symptoms are more likely to be shoulder musculoskeletal leg pain. But given his EKG changes as well as troponins cardiology was consulted. Highly appreciate their assistance. Stress test was low risk. We will continue  aspirin and Crestor on discharge. No further work-up for now.  Essential hypertension Recommend outpatient follow-up with PCP since the blood pressure improved significantly here in the hospital.  Obesity. Reason the patient at high risk for poor outcome. Recommended sleep study. Body mass index is 35.11 kg/m.   Patient was ambulatory without any assistance. On the day of the discharge the patient's vitals were stable, and no other acute medical condition were reported by patient. The patient was felt safe to be discharge at Home with no therapy needed on  discharge.  Consultants: Cardiology  Procedures: Echocardiogram  Nuclear medicine stress test  DISCHARGE MEDICATION: Allergies as of 06/18/2020      Reactions   Celecoxib    REACTION: nose bleeds   Clopidogrel Bisulfate    REACTION: Lower GI bleed   Ramipril    REACTION: Throat swelling      Medication List    STOP taking these medications   acetaminophen-codeine 300-30 MG tablet Commonly known as: TYLENOL #3   indomethacin 25 MG capsule Commonly known as: INDOCIN     TAKE these medications   aspirin EC 81 MG tablet Take 81 mg by mouth daily. Swallow whole.   Euthyrox 125 MCG tablet Generic drug: levothyroxine Take 125 mcg by mouth daily. What changed: Another medication with the same name was removed. Continue taking this medication, and follow the directions you see here.   rosuvastatin 20 MG tablet Commonly known as: CRESTOR Take 1 tablet (20 mg total) by mouth daily at 6 PM.       Discharge Exam: Filed Weights   06/17/20 1011  Weight: 127.4 kg   Vitals:   06/17/20 2104 06/18/20 0440  BP: (!) 148/95 128/83  Pulse: (!) 51 (!) 58  Resp: 20 20  Temp: 98.8 F (37.1 C) 98.3 F (36.8 C)  SpO2: 98% 96%   General: Appear in no distress, no Rash; Oral Mucosa Clear, moist. no Abnormal Neck Mass Or lumps, Conjunctiva normal  Cardiovascular: S1 and S2 Present, no Murmur Respiratory: good respiratory effort, Bilateral Air entry present and CTA, no Crackles, no wheezes Abdomen: Bowel Sound present, Soft and no tenderness Extremities: no Pedal edema Neurology: alert and oriented to time, place, and person affect appropriate. no new focal deficit  The results of significant diagnostics from this hospitalization (including imaging, microbiology, ancillary and laboratory) are listed below for reference.    Significant Diagnostic Studies: CT Head Wo Contrast  Result Date: 06/16/2020 CLINICAL DATA:  Sharp intermittent head pain with left arm pain and numbness  EXAM: CT HEAD WITHOUT CONTRAST TECHNIQUE: Contiguous axial images were obtained from the base of the skull through the vertex without intravenous contrast. COMPARISON:  None. FINDINGS: Brain: No evidence of acute infarction, hemorrhage, hydrocephalus, extra-axial collection or mass lesion/mass effect. Vascular: No hyperdense vessels.  Carotid vascular calcification Skull: Normal. Negative for fracture or focal lesion. Sinuses/Orbits: No acute finding. Other: None IMPRESSION: Negative non contrasted CT appearance of the brain. Electronically Signed   By: Donavan Foil M.D.   On: 06/16/2020 18:54   CT Cervical Spine Wo Contrast  Result Date: 06/16/2020 CLINICAL DATA:  Arm pain and numbness EXAM: CT CERVICAL SPINE WITHOUT CONTRAST TECHNIQUE: Multidetector CT imaging of the cervical spine was performed without intravenous contrast. Multiplanar CT image reconstructions were also generated. COMPARISON:  CT cervical spine 10/02/2010 FINDINGS: Alignment: Mild reversal of cervical lordosis. Facet alignment within normal limits. Skull base and vertebrae: No acute fracture. No primary bone lesion or focal pathologic process. Soft  tissues and spinal canal: No prevertebral fluid or swelling. No visible canal hematoma. Disc levels: Diffuse degenerative changes throughout the cervical spine with moderate disc space narrowing and degenerative change C3-C4, C4-C5, C5-C6 C7. Facet degenerative changes at multiple levels. Upper chest: Lung apices are clear. Stable to decreased size of 1.7 cm hypodense nodule right lobe of thyroid with calcification. Other: None IMPRESSION: Mild reversal of cervical lordosis with diffuse degenerative changes. No acute osseous abnormality. Stable to slight decreased size of 1.7 cm hypodense nodule right lobe of thyroid. Stability for greater than 5 years implies benignity; no biopsy or followup indicated (ref: J Am Coll Radiol. 2015 Feb;12(2): 143-50). Electronically Signed   By: Donavan Foil M.D.    On: 06/16/2020 19:00   NM Myocar Multi W/Spect W/Wall Motion / EF  Result Date: 06/18/2020  No diagnostic accentuation in resting ST segment abnormalities.  Small, mild intensity, basal inferior defect that is partially reversible in the setting of both diaphragmatic attenuation and also radiotracer uptake near the inferior wall on stress imaging. Most likely artifact rather than mild ischemic territory. Summed stress score is 1 consistent with low risk.  This is a low risk study.  Nuclear stress EF: 52%, visually appears higher.    DG Chest Port 1 View  Result Date: 06/16/2020 CLINICAL DATA:  Left arm pain EXAM: PORTABLE CHEST 1 VIEW COMPARISON:  05/16/2018 FINDINGS: No focal airspace disease or pleural effusion. Borderline cardiomegaly. Aortic atherosclerosis. No pneumothorax. IMPRESSION: No active disease. Borderline cardiomegaly. Electronically Signed   By: Donavan Foil M.D.   On: 06/16/2020 18:52   ECHOCARDIOGRAM COMPLETE  Result Date: 06/17/2020    ECHOCARDIOGRAM REPORT   Patient Name:   [name] [name] Date of Exam: 06/17/2020 Medical Rec #:  [id number]        Height:       75.0 in Accession #:    [id number]       Weight:       290.0 lb Date of Birth:  1951/03/08        BSA:          2.570 m Patient Age:    70 years         BP:           163/80 mmHg Patient Gender: M                HR:           54 bpm. Exam Location:  [facility] Procedure: 2D Echo Indications:    Chest Pain R07.9  History:        Patient has prior history of Echocardiogram examinations, most                 recent 12/11/2009. Signs/Symptoms:Murmur; Risk                 Factors:Non-Smoker, Hypertension and Dyslipidemia. Sinus                 Bradycardia, Elevated Troponin.  Sonographer:    Leavy Cella RDCS (AE) Referring Phys: [id number] [place]  1. Left ventricular ejection fraction, by estimation, is 55 to 60%. The left ventricle has normal function. The left ventricle has no regional wall motion  abnormalities. There is mild left ventricular hypertrophy. Left ventricular diastolic parameters are indeterminate.  2. Right ventricular systolic function is low normal. The right ventricular size is normal. Tricuspid regurgitation signal is inadequate for assessing PA pressure.  3. The mitral valve is  grossly normal. No evidence of mitral valve regurgitation.  4. The aortic valve is tricuspid. Aortic valve regurgitation is not visualized. Mild aortic valve sclerosis is present, with no evidence of aortic valve stenosis.  5. The inferior vena cava is normal in size with greater than 50% respiratory variability, suggesting right atrial pressure of 3 mmHg. FINDINGS  Left Ventricle: Left ventricular ejection fraction, by estimation, is 55 to 60%. The left ventricle has normal function. The left ventricle has no regional wall motion abnormalities. The left ventricular internal cavity size was normal in size. There is  mild left ventricular hypertrophy. Left ventricular diastolic parameters are indeterminate. Right Ventricle: The right ventricular size is normal. No increase in right ventricular wall thickness. Right ventricular systolic function is low normal. Tricuspid regurgitation signal is inadequate for assessing PA pressure. Left Atrium: Left atrial size was normal in size. Right Atrium: Right atrial size was normal in size. Pericardium: There is no evidence of pericardial effusion. Mitral Valve: The mitral valve is grossly normal. Mild mitral annular calcification. No evidence of mitral valve regurgitation. Tricuspid Valve: The tricuspid valve is grossly normal. Tricuspid valve regurgitation is trivial. Aortic Valve: The aortic valve is tricuspid. There is mild aortic valve annular calcification. Aortic valve regurgitation is not visualized. Mild aortic valve sclerosis is present, with no evidence of aortic valve stenosis. Pulmonic Valve: The pulmonic valve was grossly normal. Pulmonic valve regurgitation is  trivial. Aorta: The aortic root is normal in size and structure. Venous: The inferior vena cava is normal in size with greater than 50% respiratory variability, suggesting right atrial pressure of 3 mmHg. IAS/Shunts: No atrial level shunt detected by color flow Doppler.  LEFT VENTRICLE PLAX 2D LVIDd:         5.27 cm  Diastology LVIDs:         3.28 cm  LV e' medial:    5.11 cm/s LV PW:         1.34 cm  LV E/e' medial:  16.8 LV IVS:        1.22 cm  LV e' lateral:   4.35 cm/s LVOT diam:     2.10 cm  LV E/e' lateral: 19.7 LVOT Area:     3.46 cm  RIGHT VENTRICLE RV S prime:     17.90 cm/s TAPSE (M-mode): 2.9 cm LEFT ATRIUM             Index       RIGHT ATRIUM           Index LA diam:        3.90 cm 1.52 cm/m  RA Area:     12.30 cm LA Vol (A2C):   27.9 ml 10.86 ml/m RA Volume:   31.70 ml  12.34 ml/m LA Vol (A4C):   44.5 ml 17.32 ml/m LA Biplane Vol: 35.5 ml 13.81 ml/m   AORTA Ao Root diam: 2.70 cm MITRAL VALVE MV Area (PHT): 2.90 cm    SHUNTS MV Decel Time: 262 msec    Systemic Diam: 2.10 cm MV E velocity: 85.70 cm/s MV A velocity: 71.10 cm/s MV E/A ratio:  1.21 Rozann Lesches MD Electronically signed by Rozann Lesches MD Signature Date/Time: 06/17/2020/11:38:49 AM    Final     Microbiology: Recent Results (from the past 240 hour(s))  SARS CORONAVIRUS 2 (TAT 6-24 HRS) Nasopharyngeal Nasopharyngeal Swab     Status: None   Collection Time: 06/16/20 11:12 PM   Specimen: Nasopharyngeal Swab  Result Value Ref Range Status   SARS  Coronavirus 2 NEGATIVE NEGATIVE Final    Comment: (NOTE) SARS-CoV-2 target nucleic acids are NOT DETECTED.  The SARS-CoV-2 RNA is generally detectable in upper and lower respiratory specimens during the acute phase of infection. Negative results do not preclude SARS-CoV-2 infection, do not rule out co-infections with other pathogens, and should not be used as the sole basis for treatment or other patient management decisions. Negative results must be combined with clinical  observations, patient history, and epidemiological information. The expected result is Negative.  Fact Sheet for Patients: SugarRoll.be  Fact Sheet for Healthcare Providers: https://www.woods-mathews.com/  This test is not yet approved or cleared by the [place] FDA and  has been authorized for detection and/or diagnosis of SARS-CoV-2 by FDA under an Emergency Use Authorization (EUA). This EUA will remain  in effect (meaning this test can be used) for the duration of the COVID-19 declaration under Se ction 564(b)(1) of the Act, 21 U.S.C. section 360bbb-3(b)(1), unless the authorization is terminated or revoked sooner.  Performed at [facility], [place] [address] [place], [place] [address]      Labs: CBC: Recent Labs  Lab 06/16/20 1924 06/18/20 0406  WBC 4.8 4.8  NEUTROABS 1.5*  --   HGB 12.4* 12.7*  HCT 38.2* 40.5  MCV 96.0 95.5  PLT 311 888   Basic Metabolic Panel: Recent Labs  Lab 06/16/20 1924 06/18/20 0406  NA 135 138  K 3.8 4.0  CL 104 103  CO2 25 28  GLUCOSE 97 95  BUN 18 18  CREATININE 1.11 1.12  CALCIUM 8.9 9.4  MG  --  2.2   Liver Function Tests: Recent Labs  Lab 06/16/20 1924  AST 24  ALT 20  ALKPHOS 43  BILITOT 0.4  PROT 7.1  ALBUMIN 3.7   CBG: No results for input(s): GLUCAP in the last 168 hours.  Time spent: 35 minutes  Signed:  Berle Mull  [facility] 06/18/2020 4:50 PM

## 2020-06-29 ENCOUNTER — Encounter: Payer: Self-pay | Admitting: Physician Assistant

## 2020-06-29 ENCOUNTER — Ambulatory Visit (INDEPENDENT_AMBULATORY_CARE_PROVIDER_SITE_OTHER): Payer: Medicare Other | Admitting: Physician Assistant

## 2020-06-29 ENCOUNTER — Other Ambulatory Visit: Payer: Self-pay

## 2020-06-29 VITALS — BP 154/86 | HR 60 | Ht 75.0 in | Wt 280.6 lb

## 2020-06-29 DIAGNOSIS — I495 Sick sinus syndrome: Secondary | ICD-10-CM

## 2020-06-29 DIAGNOSIS — I2510 Atherosclerotic heart disease of native coronary artery without angina pectoris: Secondary | ICD-10-CM | POA: Diagnosis not present

## 2020-06-29 DIAGNOSIS — G4733 Obstructive sleep apnea (adult) (pediatric): Secondary | ICD-10-CM

## 2020-06-29 NOTE — Patient Instructions (Signed)
Medication Instructions:  Your physician recommends that you continue on your current medications as directed. Please refer to the Current Medication list given to you today.  *If you need a refill on your cardiac medications before your next appointment, please call your pharmacy*   Lab Work: none If you have labs (blood work) drawn today and your tests are completely normal, you will receive your results only by: Marland Kitchen MyChart Message (if you have MyChart) OR . A paper copy in the mail If you have any lab test that is abnormal or we need to change your treatment, we will call you to review the results.   Testing/Procedures: None   Follow-Up: At [facility], you and your health needs are our priority.  As part of our continuing mission to provide you with exceptional heart care, we have created designated Provider Care Teams.  These Care Teams include your primary Cardiologist (physician) and Advanced Practice Providers (APPs -  Physician Assistants and Nurse Practitioners) who all work together to provide you with the care you need, when you need it.  We recommend signing up for the patient portal called "MyChart".  Sign up information is provided on this After Visit Summary.  MyChart is used to connect with patients for Virtual Visits (Telemedicine).  Patients are able to view lab/test results, encounter notes, upcoming appointments, etc.  Non-urgent messages can be sent to your provider as well.   To learn more about what you can do with MyChart, go to [email or web address].    Your next appointment:   6 month(s)  The format for your next appointment:   In Person  Provider:   Rosaria Ferries, PA-C   Other Instructions

## 2020-06-29 NOTE — Progress Notes (Signed)
Cardiology Office Note   Date:  06/29/2020   ID:  [name] [name], DOB [birth year], MRN [id number]  PCP:  Celene Squibb, MD Cardiologist:  No primary care provider on file. New, TBD Electrphysiologist: None Hunner Garcon, PA-C   No chief complaint on file.   History of Present Illness: [name] [name] is a 70 y.o. male with a history of CAD (mild to moderate CAD by cath in 2006), HTN and Hypothyroidism   Admitted 02/22-02/24 with L arm/shoulder MS pain, abnl ECG and elevated troponin cards saw >> MV low risk, ?OSA >> needs sleep study  [name] [name] presents for cardiology follow up.  His shoulder pain has resolved. He still has L arm pain in the upper arm at times, it is mild now. He gets it when he is in bed. Works his arm gradually and the pain improves.  Dr Nevada Crane has ordered a sleep study in the past, he never followed up.  He has woken up suddenly in the night once, does not generally do this. He does not always feel rested when he gets up. He snores, more so when sleeping on his back, not heavily.  Epworth Sleepiness Scale is significantly elevated at 16  Does not wake with LE edema, denies orthopnea or PND.   No exertional chest pain.  COVID status: un-vaccinated, had/did not have COVID Past Medical History:  Diagnosis Date  . Anxiety state, unspecified   . Backache, unspecified   . Coronary artery disease   . Cramp of limb   . Depressive disorder, not elsewhere classified   . Disturbance of skin sensation   . Esophageal reflux   . Gout, unspecified   . Hyperlipidemia   . Hypertension   . Hypertonicity of bladder   . Irritable bowel syndrome   . Lumbago   . Obesity, unspecified   . Old myocardial infarction   . Open wound of hand except finger(s) alone, without mention of complication   . Osteoarthrosis, unspecified whether generalized or localized, unspecified site   . Other specified visual disturbances   . Pain in joint, lower leg   . Peptic ulcer,  unspecified site, unspecified as acute or chronic, without mention of hemorrhage, perforation, or obstruction   . Psychosexual dysfunction with inhibited sexual excitement   . Sinus bradycardia   . Unspecified constipation   . Unspecified hypertrophic and atrophic condition of skin   . Unspecified hypothyroidism     Past Surgical History:  Procedure Laterality Date  . CARDIAC CATHETERIZATION  2005  . WISDOM TOOTH EXTRACTION      Current Outpatient Medications  Medication Sig Dispense Refill  . aspirin EC 81 MG tablet Take 81 mg by mouth daily. Swallow whole.    Arna Medici 125 MCG tablet Take 125 mcg by mouth daily.    . rosuvastatin (CRESTOR) 20 MG tablet Take 1 tablet (20 mg total) by mouth daily at 6 PM. 30 tablet 0   No current facility-administered medications for this visit.    Allergies:   Celecoxib, Clopidogrel bisulfate, and Ramipril    Social History:  The patient  reports that he has never smoked. He has never used smokeless tobacco. He reports that he does not drink alcohol and does not use drugs.   Family History:  The patient's family history includes Alzheimer's disease in his mother; Coronary artery disease in his brother; Diabetes in his brother; Hypertension in his brother and mother; Lung cancer in his father.  He indicated  that his mother is alive. He indicated that his father is deceased. He indicated that both of his sisters are alive. He indicated that his brother is alive.    ROS:  Please see the history of present illness. All other systems are reviewed and negative.    PHYSICAL EXAM: VS:  BP (!) 154/86   Pulse 60   Ht 6\' 3"  (1.905 m)   Wt 280 lb 9.6 oz (127.3 kg)   SpO2 96%   BMI 35.07 kg/m  , BMI Body mass index is 35.07 kg/m. GEN: Well nourished, well developed, male in no acute distress HEENT: normal for age  Neck: no JVD, no carotid bruit, no masses Cardiac: RRR; no murmur, no rubs, or gallops Respiratory:  clear to auscultation  bilaterally, normal work of breathing GI: soft, nontender, nondistended, + BS MS: no deformity or atrophy; no edema; distal pulses are 2+ in all 4 extremities  Skin: warm and dry, no rash Neuro:  Strength and sensation are intact Psych: euthymic mood, full affect   EKG:  EKG is not ordered today.   ECHO: 06/17/2020 1. Left ventricular ejection fraction, by estimation, is 55 to 60%. The  left ventricle has normal function. The left ventricle has no regional  wall motion abnormalities. There is mild left ventricular hypertrophy.  Left ventricular diastolic parameters  are indeterminate.  2. Right ventricular systolic function is low normal. The right  ventricular size is normal. Tricuspid regurgitation signal is inadequate  for assessing PA pressure.  3. The mitral valve is grossly normal. No evidence of mitral valve  regurgitation.  4. The aortic valve is tricuspid. Aortic valve regurgitation is not  visualized. Mild aortic valve sclerosis is present, with no evidence of  aortic valve stenosis.  5. The inferior vena cava is normal in size with greater than 50%  respiratory variability, suggesting right atrial pressure of 3 mmHg.   MYOVIEW: 06/18/2020  No diagnostic accentuation in resting ST segment abnormalities.  Small, mild intensity, basal inferior defect that is partially reversible in the setting of both diaphragmatic attenuation and also radiotracer uptake near the inferior wall on stress imaging. Most likely artifact rather than mild ischemic territory. Summed stress score is 1 consistent with low risk.  This is a low risk study.  Nuclear stress EF: 52%, visually appears higher.   CARDIAC CATH: 10/27/2004  Coronaries:  The left main was normal.  The LAD had proximal tandem 50%  lesions after a large diagonal.  There was a mid 70% stenosis.  The first  diagonal was large with ostial 50% stenosis and proximal 60% stenosis.  The  circumflex and the AV groove had  multiple 25% proximal lesions.  There was a  50% stenosis in the mid vessel and 50% more distal after mid obtuse  marginal.  The mid obtuse marginal was a moderate sized branching vessel and  was normal.  The right coronary artery was dominant.  There were diffuse  proximal and luminal irregularities.  There was mid 40% stenosis.  There was  a long mid 30% stenosis.  There was distal 40% stenosis.  The PDA was  moderate size and normal.  The posterolateral was small and normal.   Left ventriculogram:  The left ventriculogram was obtained in the RAO  projection.  The EF was 60%.   CONCLUSION:  Diffuse coronary artery disease.  The LAD mid lesion was  moderately high grade.  Other disease did not appear to be obstructive.  I  did  review and concur this to the 2002 procedure.  There was progression,  particularly in the mid LAD and diagonal.  However, the proximal LAD and  other vessels appeared to be relatively unchanged.   PLAN:  I will have Dr. Stanford Breed review the Cardiolite.  These results do not  correlate with what I am seeing visually.  The symptoms are vague.  At this  point, multivessel angioplasty or bypass would not seem to be indicated.  If there appeared to be apical ischemia, we could management the mid LAD  lesion percutaneously.  The patient needs aggressive risk reduction.  He had  not participated in this previously and needs to be encouraged.  We will  have him follow with Dr. Wilhemina Cash and Dr. Tamala Julian.  Recent Labs: 06/16/2020: ALT 20; TSH 2.810 06/18/2020: BUN 18; Creatinine, Ser 1.12; Hemoglobin 12.7; Magnesium 2.2; Platelets 323; Potassium 4.0; Sodium 138  CBC    Component Value Date/Time   WBC 4.8 06/18/2020 0406   RBC 4.24 06/18/2020 0406   HGB 12.7 (L) 06/18/2020 0406   HCT 40.5 06/18/2020 0406   PLT 323 06/18/2020 0406   MCV 95.5 06/18/2020 0406   MCH 30.0 06/18/2020 0406   MCHC 31.4 06/18/2020 0406   RDW 12.9 06/18/2020 0406   LYMPHSABS 2.7 06/16/2020 1924    MONOABS 0.5 06/16/2020 1924   EOSABS 0.2 06/16/2020 1924   BASOSABS 0.0 06/16/2020 1924   CMP Latest Ref Rng & Units 06/18/2020 06/16/2020 07/24/2011  Glucose 70 - 99 mg/dL 95 97 167(H)  BUN 8 - 23 mg/dL 18 18 12   Creatinine 0.61 - 1.24 mg/dL 1.12 1.11 1.03  Sodium 135 - 145 mmol/L 138 135 135  Potassium 3.5 - 5.1 mmol/L 4.0 3.8 3.3(L)  Chloride 98 - 111 mmol/L 103 104 100  CO2 22 - 32 mmol/L 28 25 25   Calcium 8.9 - 10.3 mg/dL 9.4 8.9 9.5  Total Protein 6.5 - 8.1 g/dL - 7.1 -  Total Bilirubin 0.3 - 1.2 mg/dL - 0.4 -  Alkaline Phos 38 - 126 U/L - 43 -  AST 15 - 41 U/L - 24 -  ALT 0 - 44 U/L - 20 -   High Sensitivity Troponin:  Recent Labs  Lab 06/16/20 2108 06/17/20 0010 06/17/20 0208 06/17/20 0948 06/17/20 1141  TROPONINIHS 113* 114* 121* 108* 112*     Lipid Panel Lab Results  Component Value Date   CHOL 172 06/17/2020   HDL 40 (L) 06/17/2020   LDLCALC 114 (H) 06/17/2020   TRIG 92 06/17/2020   CHOLHDL 4.3 06/17/2020   Lab Results  Component Value Date   TSH 2.810 06/16/2020      Wt Readings from Last 3 Encounters:  06/29/20 280 lb 9.6 oz (127.3 kg)  06/17/20 280 lb 13.9 oz (127.4 kg)  01/16/15 290 lb (131.5 kg)     Other studies Reviewed: Additional studies/ records that were reviewed today include: Office notes, hospital records and testing.  ASSESSMENT AND PLAN:  1.  Arm pain, question anginal equivalent -His symptoms appear to be more musculoskeletal in origin -His Myoview had no obvious ischemia, abnormalities were felt to be artifact and the ischemic score was low, the test is considered low risk -Moderate but nonobstructive disease at cath in 2006, continue to treat medically -No further work-up is indicated at this time  2.  OSA: -He has some the symptoms of sleep apnea and his Epworth Sleepiness Scale was very high. -Check a split-night study and follow-up on the results  3.  History of sinus bradycardia: -Not on any medications that lower his  heart rate -History of hypothyroidism, but TSH was within normal limits when checked during his hospitalization -No discernible symptoms, no need for further treatment or evaluation   Current medicines are reviewed at length with the patient today.  The patient does not have concerns regarding medicines.  The following changes have been made:  no change  Labs/ tests ordered today include:  No orders of the defined types were placed in this encounter.    Disposition:   FU with No primary care provider on file.  Jonetta Speak, PA-C  06/29/2020 3:31 PM    [facility] Phone: [phone]; Fax: [phone]

## 2020-07-22 DIAGNOSIS — E1140 Type 2 diabetes mellitus with diabetic neuropathy, unspecified: Secondary | ICD-10-CM | POA: Diagnosis not present

## 2020-07-22 DIAGNOSIS — G3184 Mild cognitive impairment, so stated: Secondary | ICD-10-CM | POA: Diagnosis not present

## 2020-07-22 DIAGNOSIS — M1A00X0 Idiopathic chronic gout, unspecified site, without tophus (tophi): Secondary | ICD-10-CM | POA: Diagnosis not present

## 2020-07-22 DIAGNOSIS — D509 Iron deficiency anemia, unspecified: Secondary | ICD-10-CM | POA: Diagnosis not present

## 2020-07-22 DIAGNOSIS — L723 Sebaceous cyst: Secondary | ICD-10-CM | POA: Diagnosis not present

## 2020-07-22 DIAGNOSIS — G4710 Hypersomnia, unspecified: Secondary | ICD-10-CM | POA: Diagnosis not present

## 2020-07-22 DIAGNOSIS — E039 Hypothyroidism, unspecified: Secondary | ICD-10-CM | POA: Diagnosis not present

## 2020-07-22 DIAGNOSIS — E782 Mixed hyperlipidemia: Secondary | ICD-10-CM | POA: Diagnosis not present

## 2020-07-22 DIAGNOSIS — I10 Essential (primary) hypertension: Secondary | ICD-10-CM | POA: Diagnosis not present

## 2020-07-22 DIAGNOSIS — R600 Localized edema: Secondary | ICD-10-CM | POA: Diagnosis not present

## 2020-07-22 DIAGNOSIS — R21 Rash and other nonspecific skin eruption: Secondary | ICD-10-CM | POA: Diagnosis not present

## 2020-07-28 ENCOUNTER — Encounter: Payer: Self-pay | Admitting: Orthopaedic Surgery

## 2020-07-28 ENCOUNTER — Other Ambulatory Visit: Payer: Self-pay

## 2020-07-28 ENCOUNTER — Ambulatory Visit: Payer: Medicare Other

## 2020-07-28 ENCOUNTER — Ambulatory Visit (INDEPENDENT_AMBULATORY_CARE_PROVIDER_SITE_OTHER): Payer: Medicare Other | Admitting: Orthopaedic Surgery

## 2020-07-28 VITALS — BP 165/96 | HR 56 | Ht 75.0 in | Wt 285.0 lb

## 2020-07-28 DIAGNOSIS — G8929 Other chronic pain: Secondary | ICD-10-CM

## 2020-07-28 DIAGNOSIS — M25571 Pain in right ankle and joints of right foot: Secondary | ICD-10-CM | POA: Diagnosis not present

## 2020-07-28 DIAGNOSIS — M1A0710 Idiopathic chronic gout, right ankle and foot, without tophus (tophi): Secondary | ICD-10-CM | POA: Diagnosis not present

## 2020-07-28 DIAGNOSIS — L959 Vasculitis limited to the skin, unspecified: Secondary | ICD-10-CM

## 2020-07-28 MED ORDER — ALLOPURINOL 300 MG PO TABS: 300.0000 mg | ORAL_TABLET | Freq: Every day | ORAL | 5 refills | Status: AC

## 2020-07-28 MED ORDER — COLCHICINE 0.6 MG PO TABS: ORAL_TABLET | ORAL | 3 refills | Status: DC

## 2020-07-28 NOTE — Progress Notes (Signed)
Subjective:    Patient ID: [name] [name], male    DOB: [birth year], 70 y.o.   MRN: [id number]  HPI He has gout of the right ankle and has skin color changes around the right ankle and lower leg.  He takes allopurinol once in a while when it bothers him.  He also has Indocin which he takes now and then.  He sees Dr. Wende Neighbors who has told the patient to take the allopurinol daily.  I have reviewed the notes.    He has had doppler studies negative at the hospital  He has more swelling of the ankle with activity.  He has no recent trauma.  He is tired of it hurting.     Review of Systems  Constitutional: Positive for activity change.  Musculoskeletal: Positive for arthralgias, gait problem, joint swelling and myalgias.  All other systems reviewed and are negative.  For Review of Systems, all other systems reviewed and are negative.  The following is a summary of the past history medically, past history surgically, known current medicines, social history and family history.  This information is gathered electronically by the computer from prior information and documentation.  I review this each visit and have found including this information at this point in the chart is beneficial and informative.   Past Medical History:  Diagnosis Date  . Anxiety state, unspecified   . Backache, unspecified   . Coronary artery disease   . Cramp of limb   . Depressive disorder, not elsewhere classified   . Disturbance of skin sensation   . Esophageal reflux   . Gout, unspecified   . Hyperlipidemia   . Hypertension   . Hypertonicity of bladder   . Irritable bowel syndrome   . Lumbago   . Obesity, unspecified   . Old myocardial infarction   . Open wound of hand except finger(s) alone, without mention of complication   . Osteoarthrosis, unspecified whether generalized or localized, unspecified site   . Other specified visual disturbances   . Pain in joint, lower leg   . Peptic ulcer,  unspecified site, unspecified as acute or chronic, without mention of hemorrhage, perforation, or obstruction   . Psychosexual dysfunction with inhibited sexual excitement   . Sinus bradycardia   . Unspecified constipation   . Unspecified hypertrophic and atrophic condition of skin   . Unspecified hypothyroidism     Past Surgical History:  Procedure Laterality Date  . CARDIAC CATHETERIZATION  2005  . WISDOM TOOTH EXTRACTION      Current Outpatient Medications on File Prior to Visit  Medication Sig Dispense Refill  . allopurinol (ZYLOPRIM) 300 MG tablet Take 300 mg by mouth daily.    Marland Kitchen aspirin EC 81 MG tablet Take 81 mg by mouth daily. Swallow whole.    Arna Medici 125 MCG tablet Take 125 mcg by mouth daily.    . rosuvastatin (CRESTOR) 20 MG tablet Take 1 tablet (20 mg total) by mouth daily at 6 PM. 30 tablet 0   No current facility-administered medications on file prior to visit.    Social History   Socioeconomic History  . Marital status: Single    Spouse name: Not on file  . Number of children: Not on file  . Years of education: 74  . Highest education level: Not on file  Occupational History  . Occupation: Therapist, music, Museum/gallery conservator  Tobacco Use  . Smoking status: Never Smoker  . Smokeless tobacco: Never Used  Vaping Use  .  Vaping Use: Never used  Substance and Sexual Activity  . Alcohol use: No  . Drug use: No  . Sexual activity: Not on file  Other Topics Concern  . Not on file  Social History Narrative   Lives alone   Social Determinants of Health   Financial Resource Strain: Not on file  Food Insecurity: Not on file  Transportation Needs: Not on file  Physical Activity: Not on file  Stress: Not on file  Social Connections: Not on file  Intimate Partner Violence: Not on file    Family History  Problem Relation Age of Onset  . Alzheimer's disease Mother   . Hypertension Mother   . Lung cancer Father   . Hypertension Brother   . Coronary artery disease  Brother   . Diabetes Brother     BP (!) 165/96   Pulse (!) 56   Ht 6\' 3"  (1.905 m)   Wt 285 lb (129.3 kg)   BMI 35.62 kg/m   Body mass index is 35.62 kg/m.     Objective:   Physical Exam Vitals and nursing note reviewed. Exam conducted with a chaperone present.  Constitutional:      Appearance: He is well-developed.  HENT:     Head: Normocephalic and atraumatic.  Eyes:     Conjunctiva/sclera: Conjunctivae normal.     Pupils: Pupils are equal, round, and reactive to light.  Cardiovascular:     Rate and Rhythm: Normal rate and regular rhythm.  Pulmonary:     Effort: Pulmonary effort is normal.  Abdominal:     Palpations: Abdomen is soft.  Musculoskeletal:     Cervical back: Normal range of motion and neck supple.       Feet:  Skin:    General: Skin is warm and dry.  Neurological:     Mental Status: He is alert and oriented to person, place, and time.     Cranial Nerves: No cranial nerve deficit.     Motor: No abnormal muscle tone.     Coordination: Coordination normal.     Deep Tendon Reflexes: Reflexes are normal and symmetric. Reflexes normal.  Psychiatric:        Behavior: Behavior normal.        Thought Content: Thought content normal.        Judgment: Judgment normal.    X-rays were done of the right ankle, reported separately. He has arteriosclerosis of arteries.      Assessment & Plan:   Encounter Diagnoses  Name Primary?  . Chronic pain of right ankle Yes  . Idiopathic chronic gout of right ankle without tophus   . Vasculitis of skin    To see Dr. Dayton Martes, dermatology.  To begin allopurinol daily, new Rx given.  To have colchicine for acute attacks.  Try to stop the Indocin.  Return in one month.  Call if any problem.  Precautions discussed.   Electronically Signed Sanjuana Kava, MD 4/5/20222:22 PM

## 2020-08-05 DIAGNOSIS — M1000 Idiopathic gout, unspecified site: Secondary | ICD-10-CM | POA: Diagnosis not present

## 2020-08-05 DIAGNOSIS — E1165 Type 2 diabetes mellitus with hyperglycemia: Secondary | ICD-10-CM | POA: Diagnosis not present

## 2020-08-05 DIAGNOSIS — M1990 Unspecified osteoarthritis, unspecified site: Secondary | ICD-10-CM | POA: Diagnosis not present

## 2020-08-05 DIAGNOSIS — D509 Iron deficiency anemia, unspecified: Secondary | ICD-10-CM | POA: Diagnosis not present

## 2020-08-05 DIAGNOSIS — E559 Vitamin D deficiency, unspecified: Secondary | ICD-10-CM | POA: Diagnosis not present

## 2020-08-05 DIAGNOSIS — E039 Hypothyroidism, unspecified: Secondary | ICD-10-CM | POA: Diagnosis not present

## 2020-08-10 DIAGNOSIS — E039 Hypothyroidism, unspecified: Secondary | ICD-10-CM | POA: Diagnosis not present

## 2020-08-10 DIAGNOSIS — M1990 Unspecified osteoarthritis, unspecified site: Secondary | ICD-10-CM | POA: Diagnosis not present

## 2020-08-10 DIAGNOSIS — M1A00X0 Idiopathic chronic gout, unspecified site, without tophus (tophi): Secondary | ICD-10-CM | POA: Diagnosis not present

## 2020-08-10 DIAGNOSIS — E782 Mixed hyperlipidemia: Secondary | ICD-10-CM | POA: Diagnosis not present

## 2020-08-10 DIAGNOSIS — E1140 Type 2 diabetes mellitus with diabetic neuropathy, unspecified: Secondary | ICD-10-CM | POA: Diagnosis not present

## 2020-08-10 DIAGNOSIS — E559 Vitamin D deficiency, unspecified: Secondary | ICD-10-CM | POA: Diagnosis not present

## 2020-08-10 DIAGNOSIS — G9009 Other idiopathic peripheral autonomic neuropathy: Secondary | ICD-10-CM | POA: Diagnosis not present

## 2020-08-10 DIAGNOSIS — G4710 Hypersomnia, unspecified: Secondary | ICD-10-CM | POA: Diagnosis not present

## 2020-08-10 DIAGNOSIS — I10 Essential (primary) hypertension: Secondary | ICD-10-CM | POA: Diagnosis not present

## 2020-08-10 DIAGNOSIS — D509 Iron deficiency anemia, unspecified: Secondary | ICD-10-CM | POA: Diagnosis not present

## 2020-08-10 DIAGNOSIS — G3184 Mild cognitive impairment, so stated: Secondary | ICD-10-CM | POA: Diagnosis not present

## 2020-08-10 DIAGNOSIS — R600 Localized edema: Secondary | ICD-10-CM | POA: Diagnosis not present

## 2020-08-20 ENCOUNTER — Encounter: Payer: Self-pay | Admitting: Internal Medicine

## 2020-08-23 DIAGNOSIS — I10 Essential (primary) hypertension: Secondary | ICD-10-CM | POA: Diagnosis not present

## 2020-08-23 DIAGNOSIS — K219 Gastro-esophageal reflux disease without esophagitis: Secondary | ICD-10-CM | POA: Diagnosis not present

## 2020-08-25 ENCOUNTER — Encounter: Payer: Self-pay | Admitting: Orthopaedic Surgery

## 2020-08-25 ENCOUNTER — Other Ambulatory Visit: Payer: Self-pay

## 2020-08-25 ENCOUNTER — Ambulatory Visit: Payer: Medicare Other | Admitting: Orthopaedic Surgery

## 2020-08-25 VITALS — BP 143/92 | HR 55 | Ht 75.0 in | Wt 283.2 lb

## 2020-08-25 DIAGNOSIS — G8929 Other chronic pain: Secondary | ICD-10-CM | POA: Diagnosis not present

## 2020-08-25 DIAGNOSIS — M1A0710 Idiopathic chronic gout, right ankle and foot, without tophus (tophi): Secondary | ICD-10-CM | POA: Diagnosis not present

## 2020-08-25 DIAGNOSIS — L959 Vasculitis limited to the skin, unspecified: Secondary | ICD-10-CM | POA: Diagnosis not present

## 2020-08-25 NOTE — Progress Notes (Signed)
I am better  He is taking the allopurinol regularly.  He has less pain and less swelling of the right ankle.  He has a vasculitis of the lower leg and ankle.  I have asked that he see dermatology, Dr. Nevada Crane.  He understands to take the allopurinol regularly.  Encounter Diagnoses  Name Primary?  . Chronic pain of right ankle Yes  . Idiopathic chronic gout of right ankle without tophus   . Vasculitis of skin    I will see him as needed.  Call if any problem.  Precautions discussed.   Electronically Signed Sanjuana Kava, MD 5/3/202210:29 AM

## 2020-09-17 ENCOUNTER — Ambulatory Visit: Payer: Medicare Other

## 2020-12-07 DIAGNOSIS — I10 Essential (primary) hypertension: Secondary | ICD-10-CM | POA: Diagnosis not present

## 2020-12-07 DIAGNOSIS — E119 Type 2 diabetes mellitus without complications: Secondary | ICD-10-CM | POA: Diagnosis not present

## 2020-12-07 DIAGNOSIS — E0590 Thyrotoxicosis, unspecified without thyrotoxic crisis or storm: Secondary | ICD-10-CM | POA: Diagnosis not present

## 2020-12-14 DIAGNOSIS — R600 Localized edema: Secondary | ICD-10-CM | POA: Diagnosis not present

## 2020-12-14 DIAGNOSIS — E782 Mixed hyperlipidemia: Secondary | ICD-10-CM | POA: Diagnosis not present

## 2020-12-14 DIAGNOSIS — E118 Type 2 diabetes mellitus with unspecified complications: Secondary | ICD-10-CM | POA: Diagnosis not present

## 2020-12-14 DIAGNOSIS — E559 Vitamin D deficiency, unspecified: Secondary | ICD-10-CM | POA: Diagnosis not present

## 2020-12-14 DIAGNOSIS — G3184 Mild cognitive impairment, so stated: Secondary | ICD-10-CM | POA: Diagnosis not present

## 2020-12-14 DIAGNOSIS — D509 Iron deficiency anemia, unspecified: Secondary | ICD-10-CM | POA: Diagnosis not present

## 2020-12-14 DIAGNOSIS — M1990 Unspecified osteoarthritis, unspecified site: Secondary | ICD-10-CM | POA: Diagnosis not present

## 2020-12-14 DIAGNOSIS — M1A00X0 Idiopathic chronic gout, unspecified site, without tophus (tophi): Secondary | ICD-10-CM | POA: Diagnosis not present

## 2020-12-14 DIAGNOSIS — E039 Hypothyroidism, unspecified: Secondary | ICD-10-CM | POA: Diagnosis not present

## 2020-12-14 DIAGNOSIS — E1140 Type 2 diabetes mellitus with diabetic neuropathy, unspecified: Secondary | ICD-10-CM | POA: Diagnosis not present

## 2020-12-14 DIAGNOSIS — I10 Essential (primary) hypertension: Secondary | ICD-10-CM | POA: Diagnosis not present

## 2020-12-23 DIAGNOSIS — K219 Gastro-esophageal reflux disease without esophagitis: Secondary | ICD-10-CM | POA: Diagnosis not present

## 2020-12-23 DIAGNOSIS — I10 Essential (primary) hypertension: Secondary | ICD-10-CM | POA: Diagnosis not present

## 2021-01-22 DIAGNOSIS — E1165 Type 2 diabetes mellitus with hyperglycemia: Secondary | ICD-10-CM | POA: Diagnosis not present

## 2021-01-22 DIAGNOSIS — I10 Essential (primary) hypertension: Secondary | ICD-10-CM | POA: Diagnosis not present

## 2021-01-29 DIAGNOSIS — Z23 Encounter for immunization: Secondary | ICD-10-CM | POA: Diagnosis not present

## 2021-03-16 ENCOUNTER — Telehealth: Payer: Self-pay

## 2021-03-16 NOTE — Telephone Encounter (Signed)
Letter has been sent to patient informing them that their sleep study has expired. Patient will need to call and schedule an office visit to re-evaluate the need for a sleep study.    

## 2021-04-09 DIAGNOSIS — E118 Type 2 diabetes mellitus with unspecified complications: Secondary | ICD-10-CM | POA: Diagnosis not present

## 2021-04-09 DIAGNOSIS — E039 Hypothyroidism, unspecified: Secondary | ICD-10-CM | POA: Diagnosis not present

## 2021-04-09 DIAGNOSIS — E782 Mixed hyperlipidemia: Secondary | ICD-10-CM | POA: Diagnosis not present

## 2021-04-15 DIAGNOSIS — E1140 Type 2 diabetes mellitus with diabetic neuropathy, unspecified: Secondary | ICD-10-CM | POA: Diagnosis not present

## 2021-04-15 DIAGNOSIS — G3184 Mild cognitive impairment, so stated: Secondary | ICD-10-CM | POA: Diagnosis not present

## 2021-04-15 DIAGNOSIS — Z0001 Encounter for general adult medical examination with abnormal findings: Secondary | ICD-10-CM | POA: Diagnosis not present

## 2021-04-15 DIAGNOSIS — I2510 Atherosclerotic heart disease of native coronary artery without angina pectoris: Secondary | ICD-10-CM | POA: Diagnosis not present

## 2021-04-15 DIAGNOSIS — M940 Chondrocostal junction syndrome [Tietze]: Secondary | ICD-10-CM | POA: Diagnosis not present

## 2021-04-15 DIAGNOSIS — E118 Type 2 diabetes mellitus with unspecified complications: Secondary | ICD-10-CM | POA: Diagnosis not present

## 2021-04-15 DIAGNOSIS — E559 Vitamin D deficiency, unspecified: Secondary | ICD-10-CM | POA: Diagnosis not present

## 2021-04-15 DIAGNOSIS — E039 Hypothyroidism, unspecified: Secondary | ICD-10-CM | POA: Diagnosis not present

## 2021-04-15 DIAGNOSIS — R600 Localized edema: Secondary | ICD-10-CM | POA: Diagnosis not present

## 2021-04-15 DIAGNOSIS — I10 Essential (primary) hypertension: Secondary | ICD-10-CM | POA: Diagnosis not present

## 2021-04-15 DIAGNOSIS — E782 Mixed hyperlipidemia: Secondary | ICD-10-CM | POA: Diagnosis not present

## 2021-07-19 DIAGNOSIS — E119 Type 2 diabetes mellitus without complications: Secondary | ICD-10-CM | POA: Diagnosis not present

## 2021-07-19 DIAGNOSIS — I10 Essential (primary) hypertension: Secondary | ICD-10-CM | POA: Diagnosis not present

## 2021-07-19 DIAGNOSIS — E611 Iron deficiency: Secondary | ICD-10-CM | POA: Diagnosis not present

## 2021-07-19 DIAGNOSIS — E039 Hypothyroidism, unspecified: Secondary | ICD-10-CM | POA: Diagnosis not present

## 2021-07-22 DIAGNOSIS — E559 Vitamin D deficiency, unspecified: Secondary | ICD-10-CM | POA: Diagnosis not present

## 2021-07-22 DIAGNOSIS — I2510 Atherosclerotic heart disease of native coronary artery without angina pectoris: Secondary | ICD-10-CM | POA: Diagnosis not present

## 2021-07-22 DIAGNOSIS — I10 Essential (primary) hypertension: Secondary | ICD-10-CM | POA: Diagnosis not present

## 2021-07-22 DIAGNOSIS — G3184 Mild cognitive impairment, so stated: Secondary | ICD-10-CM | POA: Diagnosis not present

## 2021-07-22 DIAGNOSIS — E039 Hypothyroidism, unspecified: Secondary | ICD-10-CM | POA: Diagnosis not present

## 2021-07-22 DIAGNOSIS — E1140 Type 2 diabetes mellitus with diabetic neuropathy, unspecified: Secondary | ICD-10-CM | POA: Diagnosis not present

## 2021-07-22 DIAGNOSIS — M940 Chondrocostal junction syndrome [Tietze]: Secondary | ICD-10-CM | POA: Diagnosis not present

## 2021-07-22 DIAGNOSIS — E782 Mixed hyperlipidemia: Secondary | ICD-10-CM | POA: Diagnosis not present

## 2021-07-22 DIAGNOSIS — R600 Localized edema: Secondary | ICD-10-CM | POA: Diagnosis not present

## 2021-07-22 DIAGNOSIS — E118 Type 2 diabetes mellitus with unspecified complications: Secondary | ICD-10-CM | POA: Diagnosis not present

## 2021-07-22 DIAGNOSIS — I872 Venous insufficiency (chronic) (peripheral): Secondary | ICD-10-CM | POA: Diagnosis not present

## 2021-08-04 ENCOUNTER — Encounter: Payer: Self-pay | Admitting: *Deleted

## 2021-09-01 IMAGING — CT CT CERVICAL SPINE W/O CM
3 of 4 series · 12 of 33 positions shown, 14 images · non-contrast
Comparison: CT cervical spine 10/02/2010

CLINICAL DATA: Arm pain and numbness

EXAM:
CT CERVICAL SPINE WITHOUT CONTRAST
TECHNIQUE: Multidetector CT imaging of the cervical spine was performed without
intravenous contrast. Multiplanar CT image reconstructions were also
generated.

[Series 5: sagittal bone · sagittal · 0.27mm/px · 5 of 61 slices shown, 6 images]
[im 21/61  bone]
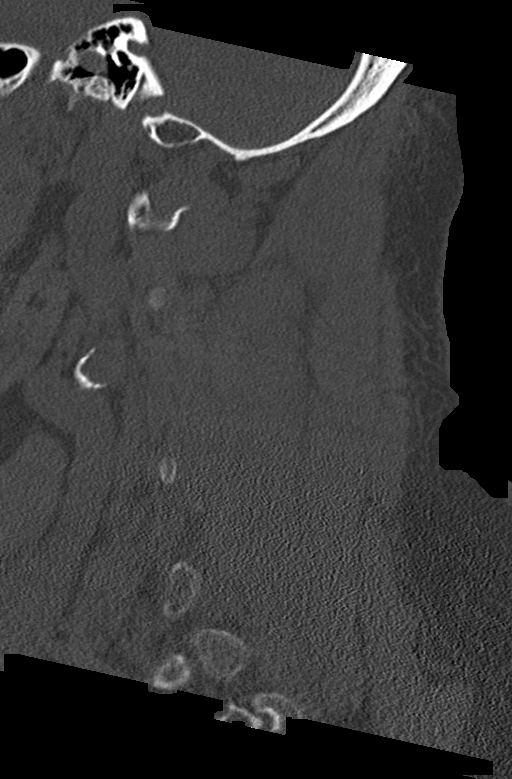
[im 26/61  bone]
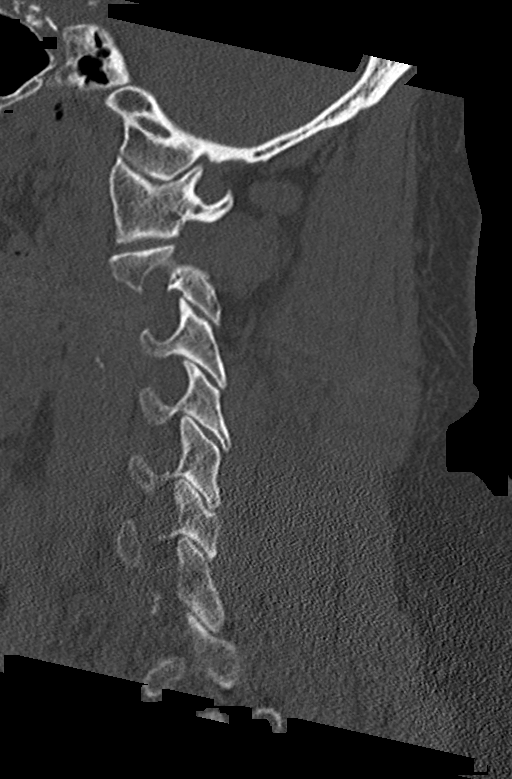
[im 31/61  soft-tissue]
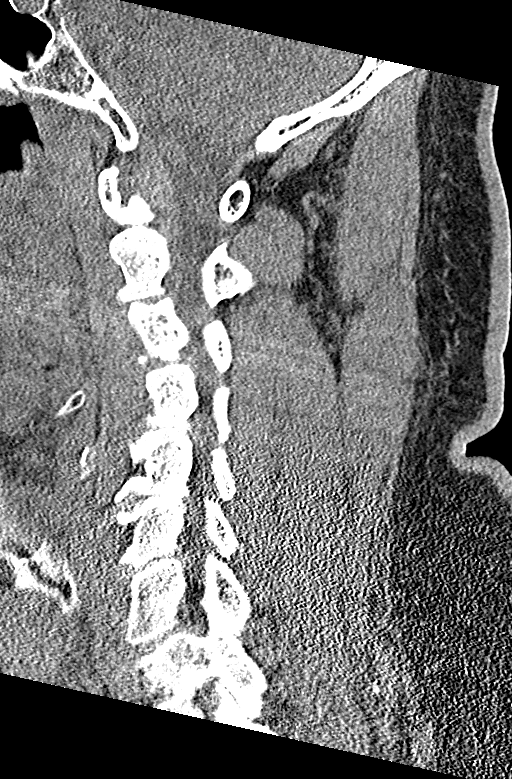
[im 31/61  bone]
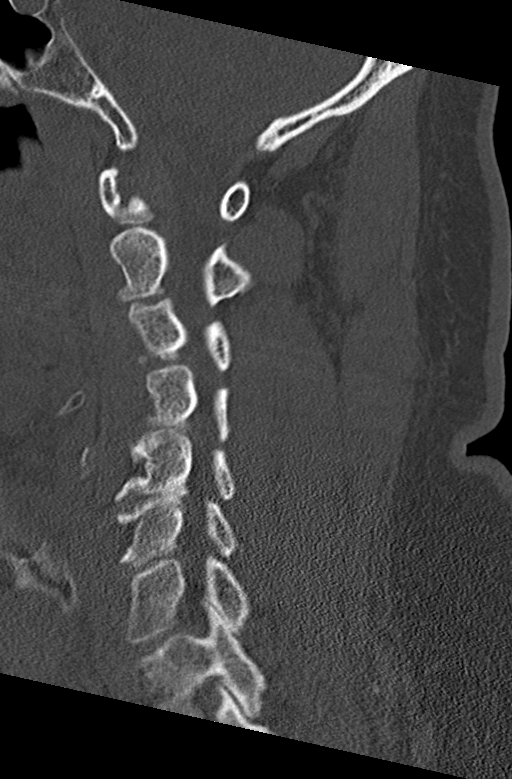
[im 36/61  bone]
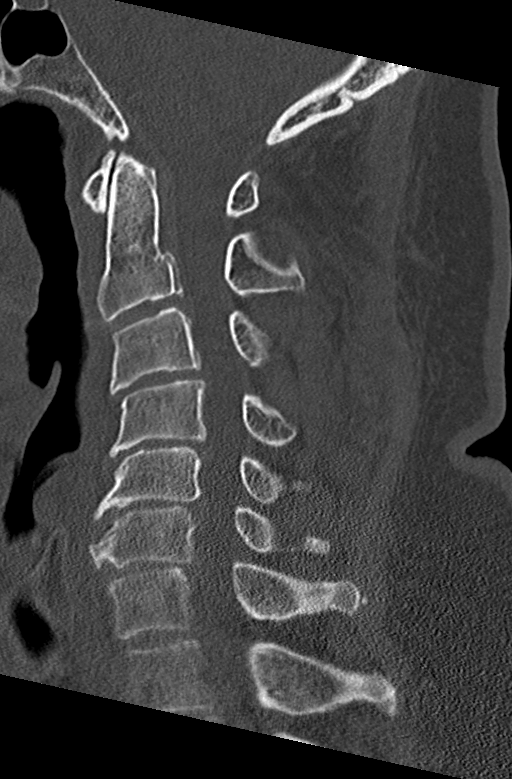
[im 41/61  bone]
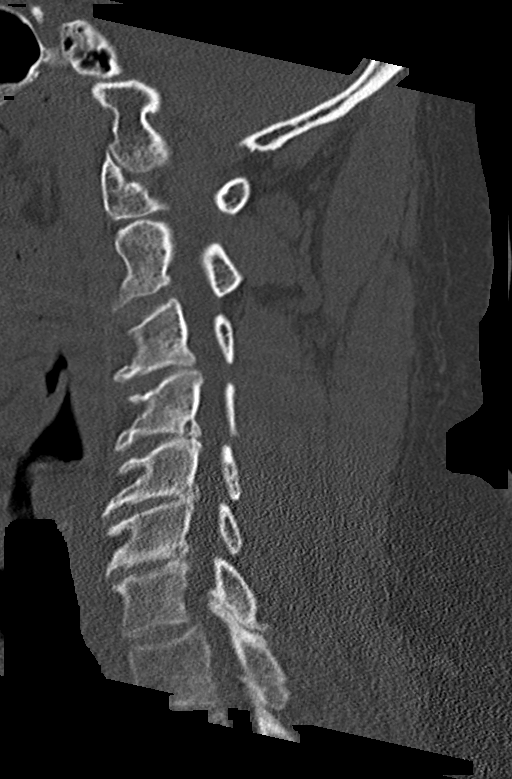

[Series 6: coronal bone · coronal · 0.23mm/px · 3 of 61 slices shown]
[im 13/61  bone]
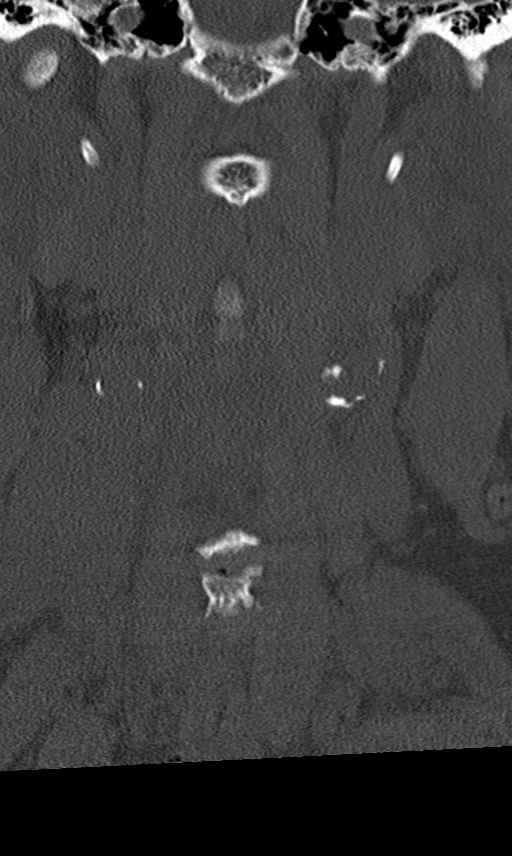
[im 25/61  bone]
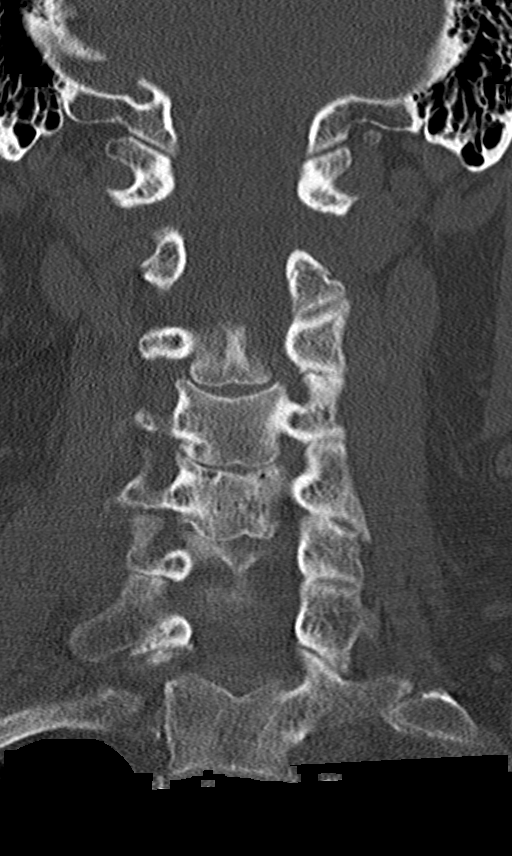
[im 37/61  bone]
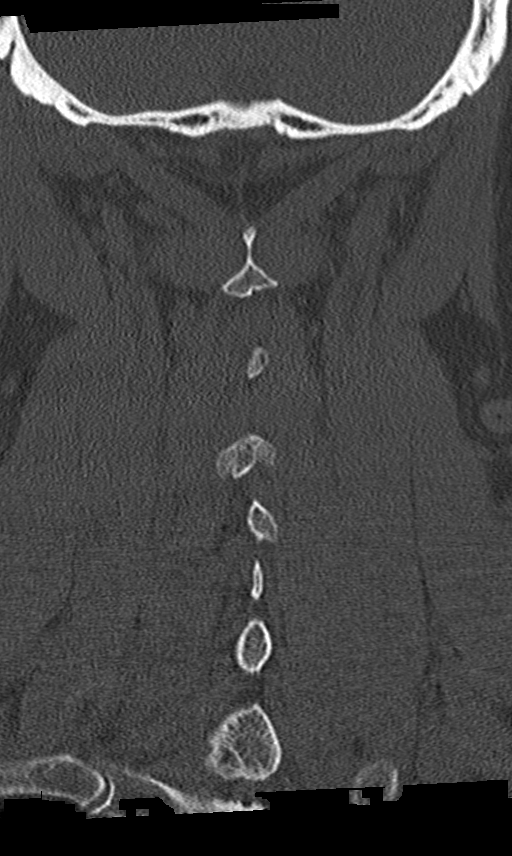

[Series 7: orthogonal axials · axial · 0.21mm/px · z∈[-255,-152]mm · 4 of 85 slices shown, 5 images]
[im 15/85  soft-tissue]
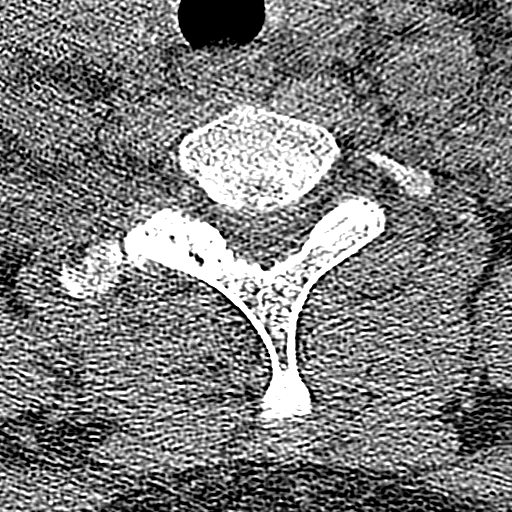
[im 15/85  bone]
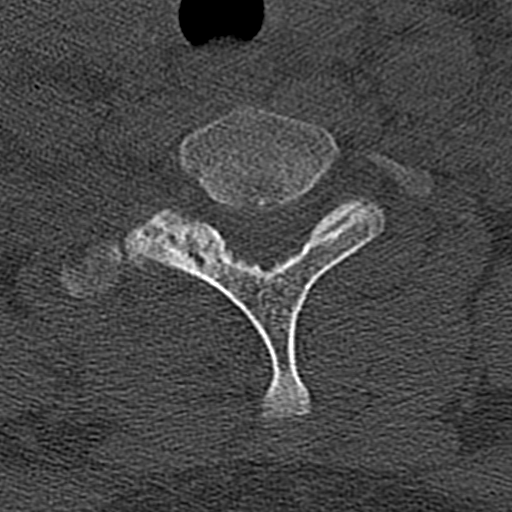
[im 29/85  bone]
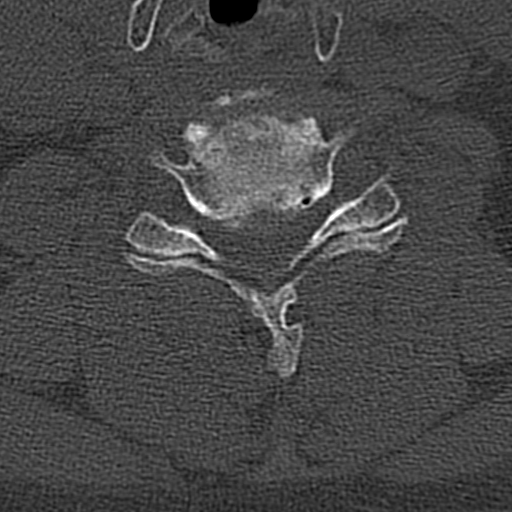
[im 57/85  bone]
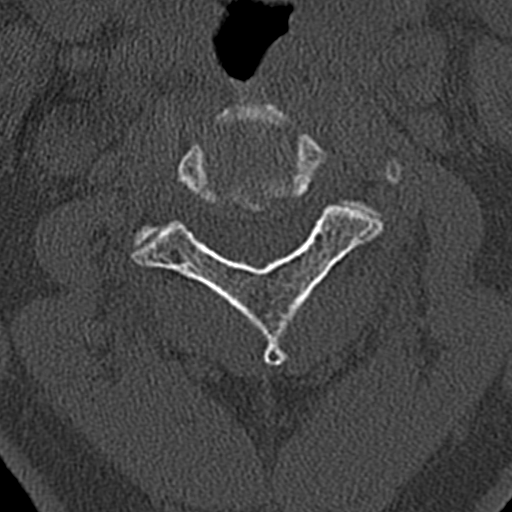
[im 71/85  bone]
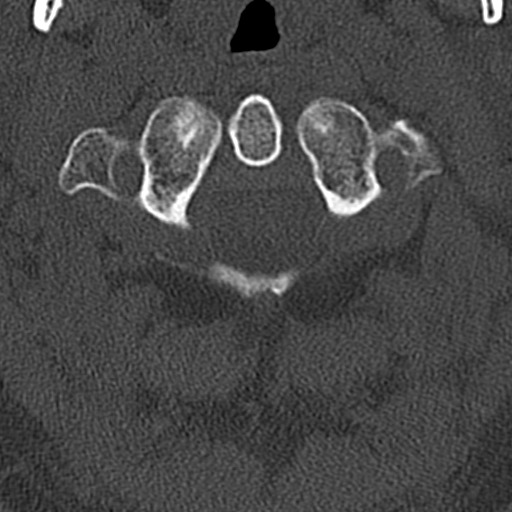

[12 of 33 positions shown; findings below may reference images not displayed]

FINDINGS: Alignment: Mild reversal of cervical lordosis. Facet alignment
within normal limits.

Skull base and vertebrae: No acute fracture. No primary bone lesion
or focal pathologic process.

Soft tissues and spinal canal: No prevertebral fluid or swelling. No
visible canal hematoma.

Disc levels: Diffuse degenerative changes throughout the cervical
spine with moderate disc space narrowing and degenerative change
C3-C4, C4-C5, C5-C6 C7. Facet degenerative changes at multiple
levels.

Upper chest: Lung apices are clear. Stable to decreased size of
cm hypodense nodule right lobe of thyroid with calcification.

Other: None
IMPRESSION: Mild reversal of cervical lordosis with diffuse degenerative
changes. No acute osseous abnormality.

Stable to slight decreased size of 1.7 cm hypodense nodule right
lobe of thyroid. Stability for greater than 5 years implies
benignity; no biopsy or followup indicated (ref: [HOSPITAL].
[DATE]): 143-50).

## 2021-09-01 IMAGING — CT CT HEAD W/O CM
3 series · 16 of 47 positions shown, 19 images · non-contrast
Comparison: None.

CLINICAL DATA: Sharp intermittent head pain with left arm pain and
numbness

EXAM:
CT HEAD WITHOUT CONTRAST
TECHNIQUE: Contiguous axial images were obtained from the base of the skull
through the vertex without intravenous contrast.

[Series 2: head w o · axial · 0.44mm/px · z∈[-99,+46]mm · 10 of 35 slices shown, 13 images]
[im 3/35  brain]
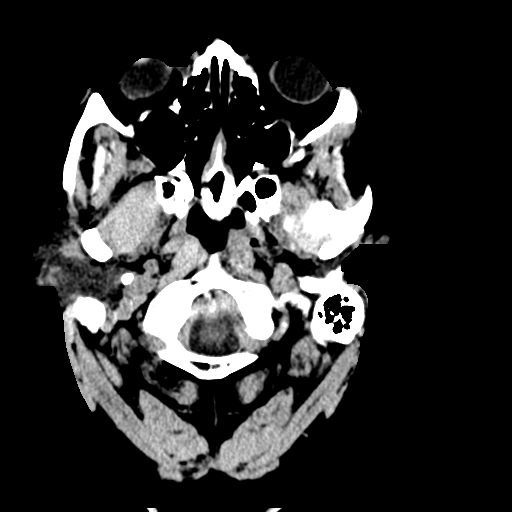
[im 3/35  bone]
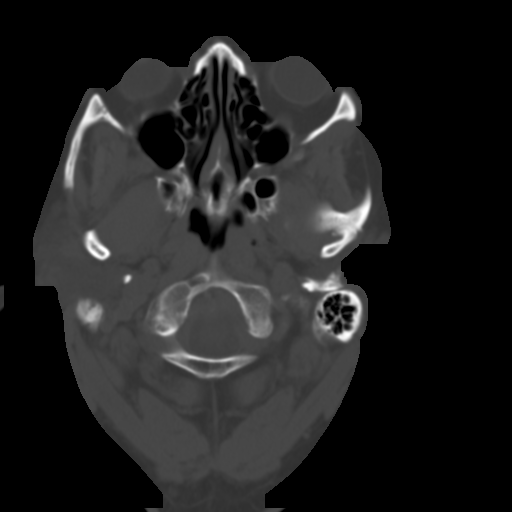
[im 6/35  brain]
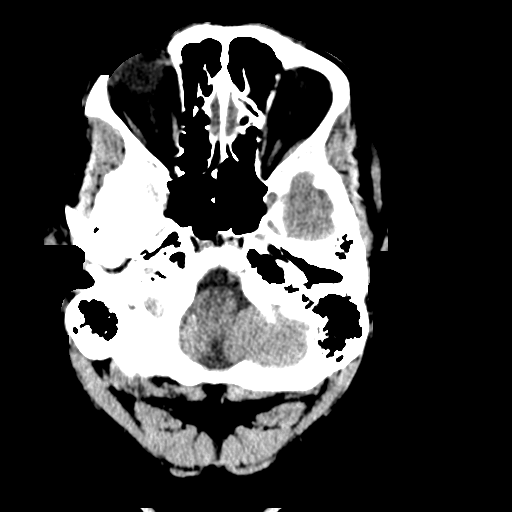
[im 10/35  brain]
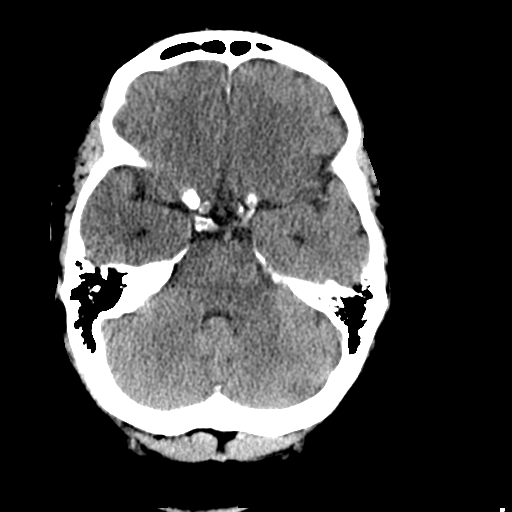
[im 12/35  brain]
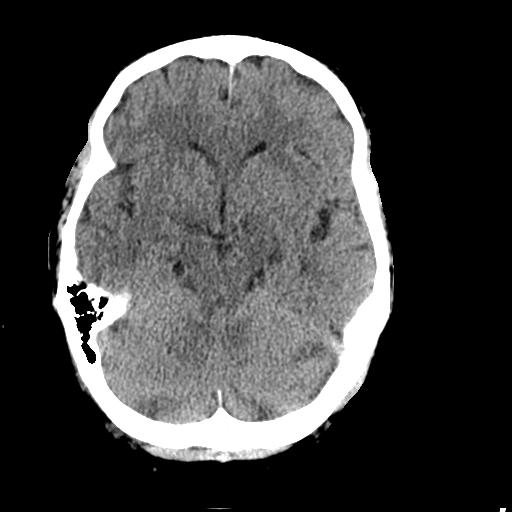
[im 16/35  brain]
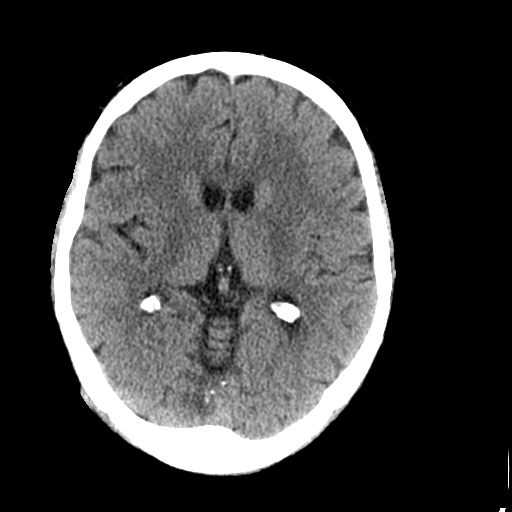
[im 16/35  bone]
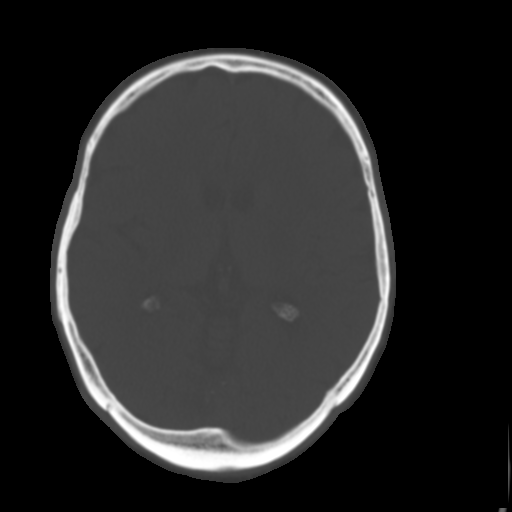
[im 19/35  brain]
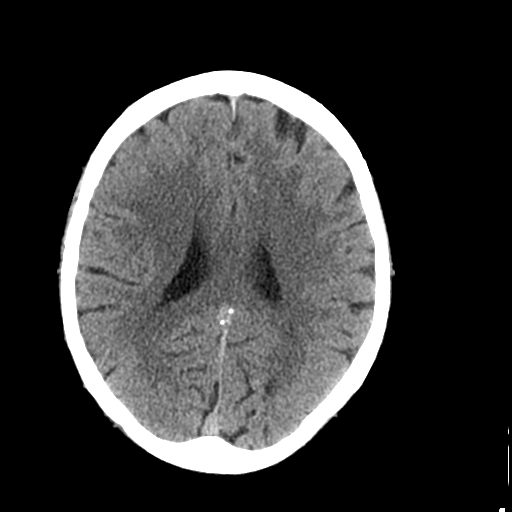
[im 23/35  brain]
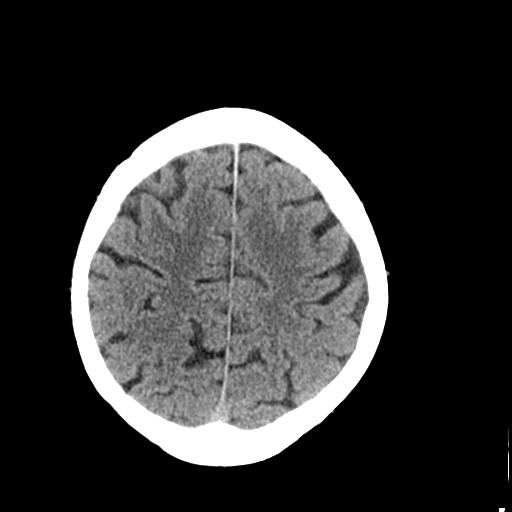
[im 26/35  brain]
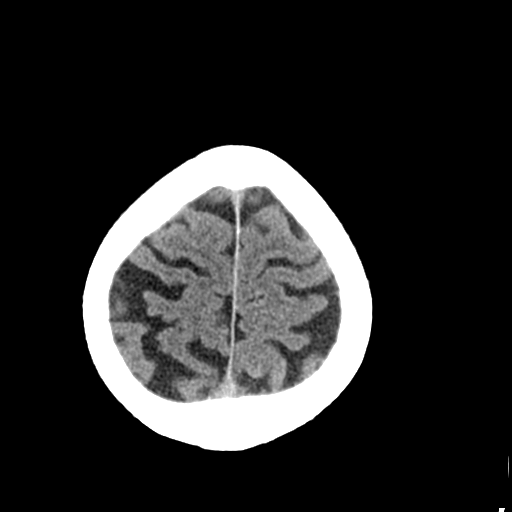
[im 29/35  brain]
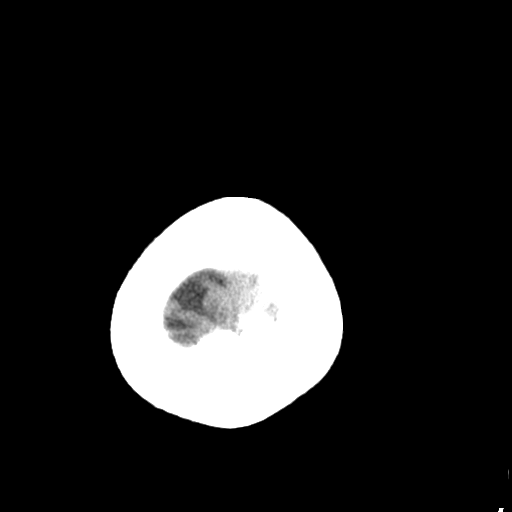
[im 29/35  bone]
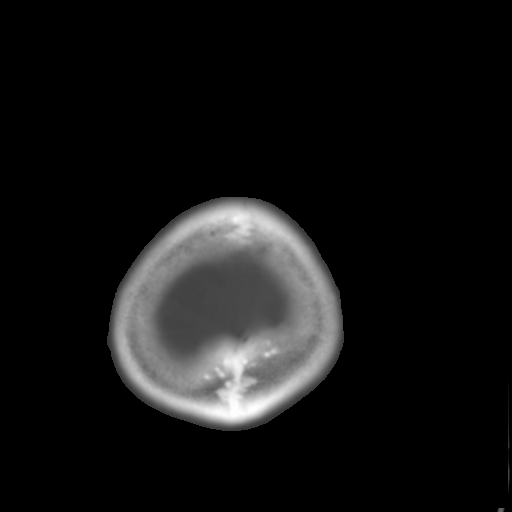
[im 32/35  brain]
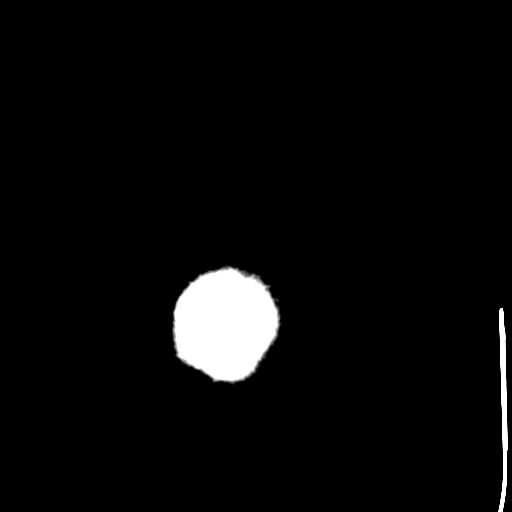

[Series 4: coronal soft · coronal · 0.34mm/px · 3 of 68 slices shown]
[im 23/68  brain]
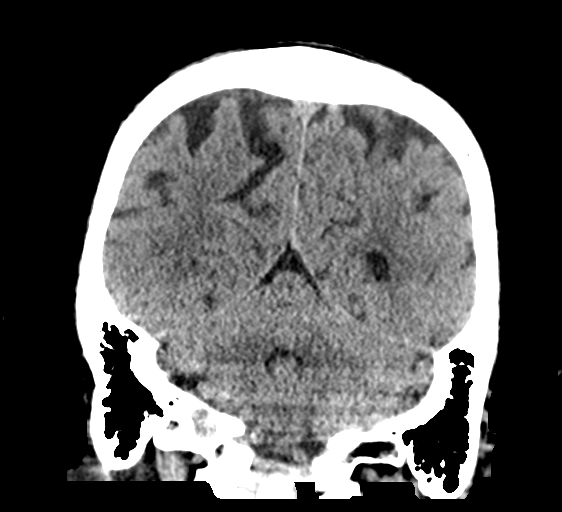
[im 30/68  brain]
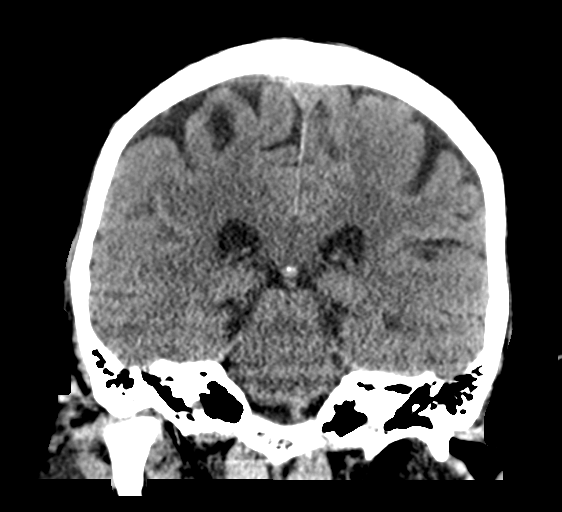
[im 38/68  brain]
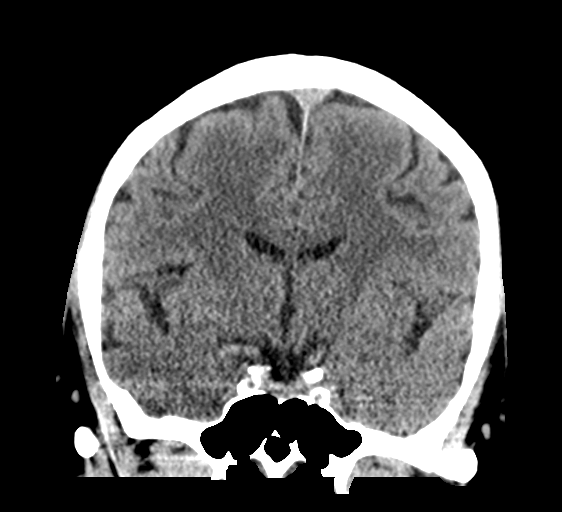

[Series 5: sagittal soft · sagittal · 0.34mm/px · 3 of 57 slices shown]
[im 19/57  brain]
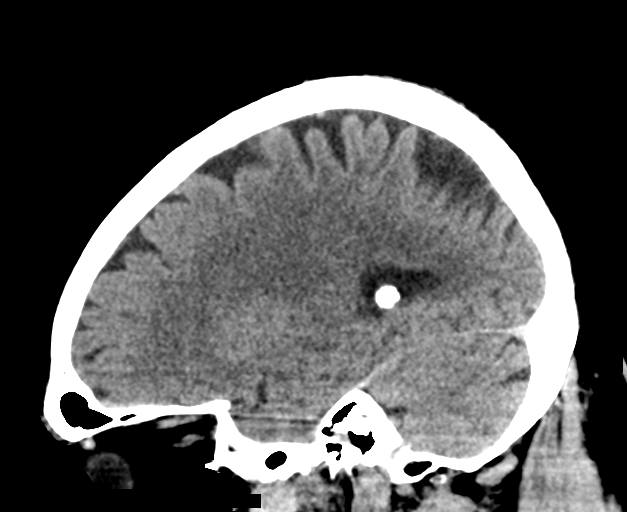
[im 29/57  brain]
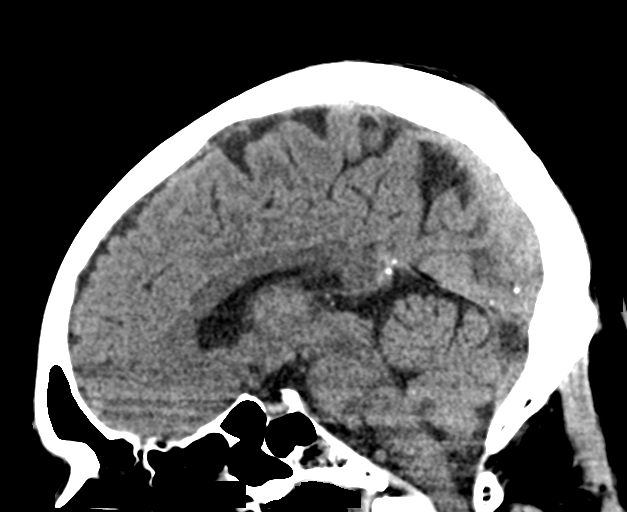
[im 38/57  brain]
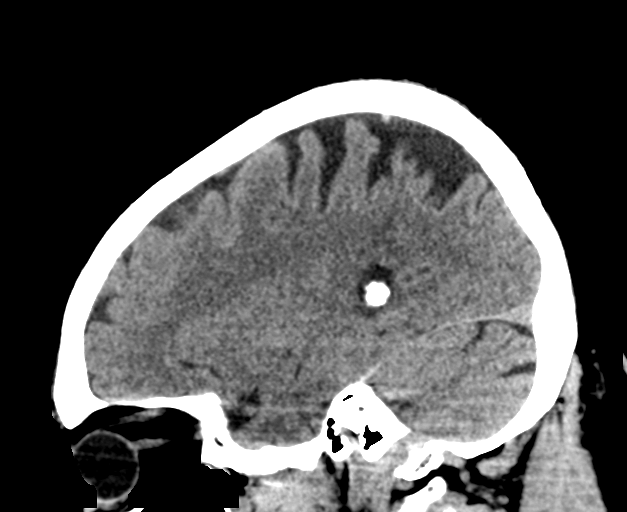

[16 of 47 positions shown; findings below may reference images not displayed]

FINDINGS: Brain: No evidence of acute infarction, hemorrhage, hydrocephalus,
extra-axial collection or mass lesion/mass effect.

Vascular: No hyperdense vessels.  Carotid vascular calcification

Skull: Normal. Negative for fracture or focal lesion.

Sinuses/Orbits: No acute finding.

Other: None
IMPRESSION: Negative non contrasted CT appearance of the brain.

## 2021-09-29 ENCOUNTER — Encounter: Payer: Self-pay | Admitting: *Deleted

## 2021-10-04 ENCOUNTER — Ambulatory Visit: Payer: Medicare Other

## 2021-10-14 DIAGNOSIS — G4710 Hypersomnia, unspecified: Secondary | ICD-10-CM

## 2021-10-22 DIAGNOSIS — E782 Mixed hyperlipidemia: Secondary | ICD-10-CM | POA: Diagnosis not present

## 2021-10-22 DIAGNOSIS — E118 Type 2 diabetes mellitus with unspecified complications: Secondary | ICD-10-CM | POA: Diagnosis not present

## 2021-10-29 ENCOUNTER — Telehealth: Payer: Self-pay | Admitting: Orthopaedic Surgery

## 2022-02-01 DIAGNOSIS — E039 Hypothyroidism, unspecified: Secondary | ICD-10-CM | POA: Diagnosis not present

## 2022-02-01 DIAGNOSIS — E782 Mixed hyperlipidemia: Secondary | ICD-10-CM | POA: Diagnosis not present

## 2022-02-01 DIAGNOSIS — M109 Gout, unspecified: Secondary | ICD-10-CM | POA: Diagnosis not present

## 2022-02-01 DIAGNOSIS — E118 Type 2 diabetes mellitus with unspecified complications: Secondary | ICD-10-CM | POA: Diagnosis not present

## 2022-02-07 DIAGNOSIS — R600 Localized edema: Secondary | ICD-10-CM | POA: Diagnosis not present

## 2022-02-07 DIAGNOSIS — E039 Hypothyroidism, unspecified: Secondary | ICD-10-CM | POA: Diagnosis not present

## 2022-02-07 DIAGNOSIS — E118 Type 2 diabetes mellitus with unspecified complications: Secondary | ICD-10-CM | POA: Diagnosis not present

## 2022-02-07 DIAGNOSIS — E1140 Type 2 diabetes mellitus with diabetic neuropathy, unspecified: Secondary | ICD-10-CM | POA: Diagnosis not present

## 2022-02-07 DIAGNOSIS — I10 Essential (primary) hypertension: Secondary | ICD-10-CM | POA: Diagnosis not present

## 2022-02-07 DIAGNOSIS — I872 Venous insufficiency (chronic) (peripheral): Secondary | ICD-10-CM | POA: Diagnosis not present

## 2022-02-07 DIAGNOSIS — E782 Mixed hyperlipidemia: Secondary | ICD-10-CM | POA: Diagnosis not present

## 2022-02-07 DIAGNOSIS — G3184 Mild cognitive impairment, so stated: Secondary | ICD-10-CM | POA: Diagnosis not present

## 2022-02-07 DIAGNOSIS — I2510 Atherosclerotic heart disease of native coronary artery without angina pectoris: Secondary | ICD-10-CM | POA: Diagnosis not present

## 2022-02-07 DIAGNOSIS — Z23 Encounter for immunization: Secondary | ICD-10-CM | POA: Diagnosis not present

## 2022-02-07 DIAGNOSIS — E559 Vitamin D deficiency, unspecified: Secondary | ICD-10-CM | POA: Diagnosis not present

## 2022-03-30 ENCOUNTER — Encounter: Payer: Self-pay | Admitting: *Deleted

## 2022-04-27 DIAGNOSIS — Z1211 Encounter for screening for malignant neoplasm of colon: Secondary | ICD-10-CM | POA: Diagnosis not present

## 2022-04-27 DIAGNOSIS — G3184 Mild cognitive impairment, so stated: Secondary | ICD-10-CM | POA: Diagnosis not present

## 2022-04-27 DIAGNOSIS — J019 Acute sinusitis, unspecified: Secondary | ICD-10-CM | POA: Diagnosis not present

## 2022-04-27 DIAGNOSIS — F630 Pathological gambling: Secondary | ICD-10-CM | POA: Diagnosis not present

## 2022-05-11 ENCOUNTER — Encounter: Payer: Self-pay | Admitting: *Deleted

## 2022-05-12 DIAGNOSIS — E039 Hypothyroidism, unspecified: Secondary | ICD-10-CM | POA: Diagnosis not present

## 2022-05-12 DIAGNOSIS — Z125 Encounter for screening for malignant neoplasm of prostate: Secondary | ICD-10-CM | POA: Diagnosis not present

## 2022-05-12 DIAGNOSIS — E782 Mixed hyperlipidemia: Secondary | ICD-10-CM | POA: Diagnosis not present

## 2022-05-12 DIAGNOSIS — E118 Type 2 diabetes mellitus with unspecified complications: Secondary | ICD-10-CM | POA: Diagnosis not present

## 2022-05-18 DIAGNOSIS — I700 Atherosclerosis of aorta: Secondary | ICD-10-CM | POA: Diagnosis not present

## 2022-05-18 DIAGNOSIS — E118 Type 2 diabetes mellitus with unspecified complications: Secondary | ICD-10-CM | POA: Diagnosis not present

## 2022-05-18 DIAGNOSIS — Z0000 Encounter for general adult medical examination without abnormal findings: Secondary | ICD-10-CM | POA: Diagnosis not present

## 2022-05-18 DIAGNOSIS — I10 Essential (primary) hypertension: Secondary | ICD-10-CM | POA: Diagnosis not present

## 2022-05-18 DIAGNOSIS — E782 Mixed hyperlipidemia: Secondary | ICD-10-CM | POA: Diagnosis not present

## 2022-05-18 DIAGNOSIS — E039 Hypothyroidism, unspecified: Secondary | ICD-10-CM | POA: Diagnosis not present

## 2022-05-18 DIAGNOSIS — G3184 Mild cognitive impairment, so stated: Secondary | ICD-10-CM | POA: Diagnosis not present

## 2022-05-18 DIAGNOSIS — E1140 Type 2 diabetes mellitus with diabetic neuropathy, unspecified: Secondary | ICD-10-CM | POA: Diagnosis not present

## 2022-07-06 DIAGNOSIS — R059 Cough, unspecified: Secondary | ICD-10-CM | POA: Diagnosis not present

## 2022-07-06 DIAGNOSIS — J069 Acute upper respiratory infection, unspecified: Secondary | ICD-10-CM | POA: Diagnosis not present

## 2022-07-06 DIAGNOSIS — I10 Essential (primary) hypertension: Secondary | ICD-10-CM | POA: Diagnosis not present

## 2022-08-25 DIAGNOSIS — E559 Vitamin D deficiency, unspecified: Secondary | ICD-10-CM | POA: Diagnosis not present

## 2022-08-25 DIAGNOSIS — E039 Hypothyroidism, unspecified: Secondary | ICD-10-CM | POA: Diagnosis not present

## 2022-08-25 DIAGNOSIS — E782 Mixed hyperlipidemia: Secondary | ICD-10-CM | POA: Diagnosis not present

## 2022-08-25 DIAGNOSIS — E118 Type 2 diabetes mellitus with unspecified complications: Secondary | ICD-10-CM | POA: Diagnosis not present

## 2022-08-30 DIAGNOSIS — E1140 Type 2 diabetes mellitus with diabetic neuropathy, unspecified: Secondary | ICD-10-CM | POA: Diagnosis not present

## 2022-08-30 DIAGNOSIS — G3184 Mild cognitive impairment, so stated: Secondary | ICD-10-CM | POA: Diagnosis not present

## 2022-08-30 DIAGNOSIS — E118 Type 2 diabetes mellitus with unspecified complications: Secondary | ICD-10-CM | POA: Diagnosis not present

## 2022-08-30 DIAGNOSIS — E559 Vitamin D deficiency, unspecified: Secondary | ICD-10-CM | POA: Diagnosis not present

## 2022-08-30 DIAGNOSIS — E1142 Type 2 diabetes mellitus with diabetic polyneuropathy: Secondary | ICD-10-CM | POA: Diagnosis not present

## 2022-08-30 DIAGNOSIS — I700 Atherosclerosis of aorta: Secondary | ICD-10-CM | POA: Diagnosis not present

## 2022-08-30 DIAGNOSIS — I10 Essential (primary) hypertension: Secondary | ICD-10-CM | POA: Diagnosis not present

## 2022-08-30 DIAGNOSIS — I2510 Atherosclerotic heart disease of native coronary artery without angina pectoris: Secondary | ICD-10-CM | POA: Diagnosis not present

## 2022-08-30 DIAGNOSIS — E782 Mixed hyperlipidemia: Secondary | ICD-10-CM | POA: Diagnosis not present

## 2022-08-30 DIAGNOSIS — R600 Localized edema: Secondary | ICD-10-CM | POA: Diagnosis not present

## 2022-08-30 DIAGNOSIS — E039 Hypothyroidism, unspecified: Secondary | ICD-10-CM | POA: Diagnosis not present

## 2022-10-25 ENCOUNTER — Encounter: Payer: Self-pay | Admitting: *Deleted

## 2022-12-28 DIAGNOSIS — E559 Vitamin D deficiency, unspecified: Secondary | ICD-10-CM | POA: Diagnosis not present

## 2022-12-28 DIAGNOSIS — E118 Type 2 diabetes mellitus with unspecified complications: Secondary | ICD-10-CM | POA: Diagnosis not present

## 2022-12-28 DIAGNOSIS — E782 Mixed hyperlipidemia: Secondary | ICD-10-CM | POA: Diagnosis not present

## 2022-12-28 DIAGNOSIS — E039 Hypothyroidism, unspecified: Secondary | ICD-10-CM | POA: Diagnosis not present

## 2023-01-02 ENCOUNTER — Other Ambulatory Visit (HOSPITAL_COMMUNITY): Payer: Self-pay | Admitting: Family Medicine

## 2023-01-02 DIAGNOSIS — E559 Vitamin D deficiency, unspecified: Secondary | ICD-10-CM | POA: Diagnosis not present

## 2023-01-02 DIAGNOSIS — I10 Essential (primary) hypertension: Secondary | ICD-10-CM | POA: Diagnosis not present

## 2023-01-02 DIAGNOSIS — E039 Hypothyroidism, unspecified: Secondary | ICD-10-CM | POA: Diagnosis not present

## 2023-01-02 DIAGNOSIS — E118 Type 2 diabetes mellitus with unspecified complications: Secondary | ICD-10-CM | POA: Diagnosis not present

## 2023-01-02 DIAGNOSIS — E782 Mixed hyperlipidemia: Secondary | ICD-10-CM | POA: Diagnosis not present

## 2023-01-02 DIAGNOSIS — E1142 Type 2 diabetes mellitus with diabetic polyneuropathy: Secondary | ICD-10-CM | POA: Diagnosis not present

## 2023-01-02 DIAGNOSIS — Z23 Encounter for immunization: Secondary | ICD-10-CM | POA: Diagnosis not present

## 2023-01-02 DIAGNOSIS — R600 Localized edema: Secondary | ICD-10-CM

## 2023-01-02 DIAGNOSIS — G3184 Mild cognitive impairment, so stated: Secondary | ICD-10-CM | POA: Diagnosis not present

## 2023-01-02 DIAGNOSIS — E1140 Type 2 diabetes mellitus with diabetic neuropathy, unspecified: Secondary | ICD-10-CM | POA: Diagnosis not present

## 2023-01-02 DIAGNOSIS — E1165 Type 2 diabetes mellitus with hyperglycemia: Secondary | ICD-10-CM | POA: Diagnosis not present

## 2023-01-02 DIAGNOSIS — I700 Atherosclerosis of aorta: Secondary | ICD-10-CM | POA: Diagnosis not present

## 2023-01-11 ENCOUNTER — Ambulatory Visit (HOSPITAL_COMMUNITY)
Admission: RE | Admit: 2023-01-11 | Discharge: 2023-01-11 | Disposition: A | Discharge status: Home / Self Care | Payer: No Typology Code available for payment source | Source: Ambulatory Visit | Attending: Family Medicine | Admitting: Family Medicine

## 2023-01-11 DIAGNOSIS — I739 Peripheral vascular disease, unspecified: Secondary | ICD-10-CM | POA: Diagnosis not present

## 2023-01-11 DIAGNOSIS — R600 Localized edema: Secondary | ICD-10-CM | POA: Diagnosis not present

## 2023-03-09 DIAGNOSIS — I10 Essential (primary) hypertension: Secondary | ICD-10-CM | POA: Diagnosis not present

## 2023-03-09 DIAGNOSIS — Z1211 Encounter for screening for malignant neoplasm of colon: Secondary | ICD-10-CM | POA: Diagnosis not present

## 2023-03-09 DIAGNOSIS — I272 Pulmonary hypertension, unspecified: Secondary | ICD-10-CM | POA: Diagnosis not present

## 2023-04-10 ENCOUNTER — Ambulatory Visit: Payer: No Typology Code available for payment source | Admitting: Diagnostic Neuroimaging

## 2023-04-11 ENCOUNTER — Encounter: Payer: Self-pay | Admitting: Diagnostic Neuroimaging

## 2023-04-11 ENCOUNTER — Ambulatory Visit: Payer: No Typology Code available for payment source | Admitting: Diagnostic Neuroimaging

## 2023-05-26 DIAGNOSIS — E782 Mixed hyperlipidemia: Secondary | ICD-10-CM | POA: Diagnosis not present

## 2023-05-26 DIAGNOSIS — E039 Hypothyroidism, unspecified: Secondary | ICD-10-CM | POA: Diagnosis not present

## 2023-05-26 DIAGNOSIS — E559 Vitamin D deficiency, unspecified: Secondary | ICD-10-CM | POA: Diagnosis not present

## 2023-05-26 DIAGNOSIS — E118 Type 2 diabetes mellitus with unspecified complications: Secondary | ICD-10-CM | POA: Diagnosis not present

## 2023-06-01 DIAGNOSIS — I2510 Atherosclerotic heart disease of native coronary artery without angina pectoris: Secondary | ICD-10-CM | POA: Diagnosis not present

## 2023-06-01 DIAGNOSIS — Z125 Encounter for screening for malignant neoplasm of prostate: Secondary | ICD-10-CM | POA: Diagnosis not present

## 2023-06-01 DIAGNOSIS — R600 Localized edema: Secondary | ICD-10-CM | POA: Diagnosis not present

## 2023-06-01 DIAGNOSIS — R0600 Dyspnea, unspecified: Secondary | ICD-10-CM | POA: Diagnosis not present

## 2023-06-01 DIAGNOSIS — E559 Vitamin D deficiency, unspecified: Secondary | ICD-10-CM | POA: Diagnosis not present

## 2023-06-01 DIAGNOSIS — E118 Type 2 diabetes mellitus with unspecified complications: Secondary | ICD-10-CM | POA: Diagnosis not present

## 2023-06-01 DIAGNOSIS — I10 Essential (primary) hypertension: Secondary | ICD-10-CM | POA: Diagnosis not present

## 2023-06-01 DIAGNOSIS — I700 Atherosclerosis of aorta: Secondary | ICD-10-CM | POA: Diagnosis not present

## 2023-06-01 DIAGNOSIS — R609 Edema, unspecified: Secondary | ICD-10-CM | POA: Diagnosis not present

## 2023-06-01 DIAGNOSIS — E1140 Type 2 diabetes mellitus with diabetic neuropathy, unspecified: Secondary | ICD-10-CM | POA: Diagnosis not present

## 2023-06-01 DIAGNOSIS — G3184 Mild cognitive impairment, so stated: Secondary | ICD-10-CM | POA: Diagnosis not present

## 2023-06-01 DIAGNOSIS — E782 Mixed hyperlipidemia: Secondary | ICD-10-CM | POA: Diagnosis not present

## 2023-06-01 DIAGNOSIS — I872 Venous insufficiency (chronic) (peripheral): Secondary | ICD-10-CM | POA: Diagnosis not present

## 2023-06-01 DIAGNOSIS — E039 Hypothyroidism, unspecified: Secondary | ICD-10-CM | POA: Diagnosis not present

## 2023-06-19 ENCOUNTER — Other Ambulatory Visit: Payer: Self-pay

## 2023-06-23 ENCOUNTER — Telehealth: Payer: Self-pay

## 2023-06-23 NOTE — Telephone Encounter (Signed)
 Auth Submission: APPROVED Site of care: Site of care: [facility] Payer: [employer]  Medication & CPT/J Code(s) submitted: Leqvio (Inclisiran) O121283 Route of submission (phone, fax, portal): portal Phone # Fax # Auth type: Buy/Bill PB Units/visits requested: 284mg , 3 visits Reference number: [id number] Approval from: 06/20/23 to 06/19/24

## 2023-09-26 DIAGNOSIS — E559 Vitamin D deficiency, unspecified: Secondary | ICD-10-CM | POA: Diagnosis not present

## 2023-09-26 DIAGNOSIS — E782 Mixed hyperlipidemia: Secondary | ICD-10-CM | POA: Diagnosis not present

## 2023-09-26 DIAGNOSIS — E118 Type 2 diabetes mellitus with unspecified complications: Secondary | ICD-10-CM | POA: Diagnosis not present

## 2023-09-26 DIAGNOSIS — E039 Hypothyroidism, unspecified: Secondary | ICD-10-CM | POA: Diagnosis not present

## 2023-09-28 DIAGNOSIS — G3184 Mild cognitive impairment, so stated: Secondary | ICD-10-CM | POA: Diagnosis not present

## 2023-09-28 DIAGNOSIS — I700 Atherosclerosis of aorta: Secondary | ICD-10-CM | POA: Diagnosis not present

## 2023-09-28 DIAGNOSIS — R600 Localized edema: Secondary | ICD-10-CM | POA: Diagnosis not present

## 2023-09-28 DIAGNOSIS — I2510 Atherosclerotic heart disease of native coronary artery without angina pectoris: Secondary | ICD-10-CM | POA: Diagnosis not present

## 2023-09-28 DIAGNOSIS — E782 Mixed hyperlipidemia: Secondary | ICD-10-CM | POA: Diagnosis not present

## 2023-09-28 DIAGNOSIS — E559 Vitamin D deficiency, unspecified: Secondary | ICD-10-CM | POA: Diagnosis not present

## 2023-09-28 DIAGNOSIS — E1140 Type 2 diabetes mellitus with diabetic neuropathy, unspecified: Secondary | ICD-10-CM | POA: Diagnosis not present

## 2023-09-28 DIAGNOSIS — E1142 Type 2 diabetes mellitus with diabetic polyneuropathy: Secondary | ICD-10-CM | POA: Diagnosis not present

## 2023-09-28 DIAGNOSIS — I10 Essential (primary) hypertension: Secondary | ICD-10-CM | POA: Diagnosis not present

## 2023-09-28 DIAGNOSIS — E118 Type 2 diabetes mellitus with unspecified complications: Secondary | ICD-10-CM | POA: Diagnosis not present

## 2023-09-28 DIAGNOSIS — E039 Hypothyroidism, unspecified: Secondary | ICD-10-CM | POA: Diagnosis not present

## 2023-10-03 ENCOUNTER — Encounter: Attending: Internal Medicine | Admitting: *Deleted

## 2023-10-03 VITALS — BP 154/84 | HR 63 | Temp 98.2°F | Resp 18

## 2023-10-03 DIAGNOSIS — E785 Hyperlipidemia, unspecified: Secondary | ICD-10-CM

## 2023-10-03 MED ORDER — INCLISIRAN SODIUM 284 MG/1.5ML ~~LOC~~ SOSY
284.0000 mg | PREFILLED_SYRINGE | Freq: Once | SUBCUTANEOUS | Status: AC
  Administered 2023-10-03: 284 mg via SUBCUTANEOUS

## 2023-10-03 NOTE — Progress Notes (Signed)
 Diagnosis: Hyperlipidemia  Provider:  Izetta Marshall MD  Procedure: Injection  Leqvio (inclisiran), Dose: 284 mg, Site: subcutaneous, Number of injections: 1  Injection Site(s): Right lower quad. abdomne  Post Care: Observation period completed  Discharge: Condition: Good, Destination: Home . AVS Provided  Performed by:  Verneda Golder, RN

## 2024-01-03 ENCOUNTER — Ambulatory Visit

## 2024-01-03 DIAGNOSIS — D229 Melanocytic nevi, unspecified: Secondary | ICD-10-CM | POA: Diagnosis not present

## 2024-01-04 ENCOUNTER — Encounter: Attending: Internal Medicine | Admitting: *Deleted

## 2024-01-04 VITALS — BP 167/96 | HR 61 | Temp 98.1°F | Resp 18

## 2024-01-04 DIAGNOSIS — E785 Hyperlipidemia, unspecified: Secondary | ICD-10-CM | POA: Insufficient documentation

## 2024-01-04 MED ORDER — INCLISIRAN SODIUM 284 MG/1.5ML ~~LOC~~ SOSY
284.0000 mg | PREFILLED_SYRINGE | Freq: Once | SUBCUTANEOUS | Status: AC
  Administered 2024-01-04: 284 mg via SUBCUTANEOUS

## 2024-01-04 NOTE — Progress Notes (Signed)
 Diagnosis: Hyperlipidemia  Provider:  Hall, Zack MD  Procedure: Injection  Leqvio  (inclisiran), Dose: 284 mg, Site: subcutaneous, Number of injections: 1  Injection Site(s): Right arm  Post Care: Observation period completed  Discharge: Condition: Good, Destination: Home . AVS Provided  Performed by:  Baldwin Darice Helling, RN

## 2024-01-23 DIAGNOSIS — E559 Vitamin D deficiency, unspecified: Secondary | ICD-10-CM | POA: Diagnosis not present

## 2024-01-23 DIAGNOSIS — E039 Hypothyroidism, unspecified: Secondary | ICD-10-CM | POA: Diagnosis not present

## 2024-01-23 DIAGNOSIS — E782 Mixed hyperlipidemia: Secondary | ICD-10-CM | POA: Diagnosis not present

## 2024-01-23 DIAGNOSIS — E118 Type 2 diabetes mellitus with unspecified complications: Secondary | ICD-10-CM | POA: Diagnosis not present

## 2024-01-29 DIAGNOSIS — E782 Mixed hyperlipidemia: Secondary | ICD-10-CM | POA: Diagnosis not present

## 2024-01-29 DIAGNOSIS — E039 Hypothyroidism, unspecified: Secondary | ICD-10-CM | POA: Diagnosis not present

## 2024-01-29 DIAGNOSIS — G3184 Mild cognitive impairment, so stated: Secondary | ICD-10-CM | POA: Diagnosis not present

## 2024-01-29 DIAGNOSIS — Z0001 Encounter for general adult medical examination with abnormal findings: Secondary | ICD-10-CM | POA: Diagnosis not present

## 2024-01-29 DIAGNOSIS — E559 Vitamin D deficiency, unspecified: Secondary | ICD-10-CM | POA: Diagnosis not present

## 2024-01-29 DIAGNOSIS — E1140 Type 2 diabetes mellitus with diabetic neuropathy, unspecified: Secondary | ICD-10-CM | POA: Diagnosis not present

## 2024-01-29 DIAGNOSIS — I10 Essential (primary) hypertension: Secondary | ICD-10-CM | POA: Diagnosis not present

## 2024-01-29 DIAGNOSIS — I700 Atherosclerosis of aorta: Secondary | ICD-10-CM | POA: Diagnosis not present

## 2024-01-29 DIAGNOSIS — E118 Type 2 diabetes mellitus with unspecified complications: Secondary | ICD-10-CM | POA: Diagnosis not present

## 2024-01-29 DIAGNOSIS — E1165 Type 2 diabetes mellitus with hyperglycemia: Secondary | ICD-10-CM | POA: Diagnosis not present

## 2024-01-29 DIAGNOSIS — R600 Localized edema: Secondary | ICD-10-CM | POA: Diagnosis not present

## 2024-05-03 ENCOUNTER — Encounter: Payer: Self-pay | Admitting: *Deleted

## 2024-05-03 NOTE — Progress Notes (Signed)
 [name] [name]                                          MRN: [id number]   05/03/2024   The [facility] Quality Team Specialist reviewed this patient medical record for the purposes of chart review for care gap closure. The following were reviewed: chart review for care gap closure-colorectal cancer screening and diabetic eye exam.    [facility] Quality Team

## 2024-05-17 ENCOUNTER — Telehealth: Payer: Self-pay | Admitting: Pharmacy Technician

## 2024-05-17 NOTE — Telephone Encounter (Addendum)
 Patient is receiving foundation assistance  Medication: Leqvio  Program: Patient [employer] Stage [employer] [facility]) Foundation Approval Dates: Approved from 02/21/24 until 05/20/25 ID: [id number] Fund Amount: $ 1900 inital

## 2024-05-22 ENCOUNTER — Encounter: Payer: Self-pay | Admitting: Internal Medicine

## 2024-07-03 ENCOUNTER — Ambulatory Visit
